# Patient Record
Sex: Male | Born: 1960
Health system: Southern US, Community
[De-identification: ages and names within clinical notes are randomized; demographics above are authoritative.]

## PROBLEM LIST (undated history)

## (undated) DIAGNOSIS — IMO0001 Reserved for inherently not codable concepts without codable children: Secondary | ICD-10-CM

## (undated) DIAGNOSIS — Z87442 Personal history of urinary calculi: Secondary | ICD-10-CM

## (undated) DIAGNOSIS — J309 Allergic rhinitis, unspecified: Secondary | ICD-10-CM

## (undated) DIAGNOSIS — N200 Calculus of kidney: Secondary | ICD-10-CM

## (undated) DIAGNOSIS — H1045 Other chronic allergic conjunctivitis: Secondary | ICD-10-CM

## (undated) DIAGNOSIS — E785 Hyperlipidemia, unspecified: Secondary | ICD-10-CM

## (undated) DIAGNOSIS — M1990 Unspecified osteoarthritis, unspecified site: Secondary | ICD-10-CM

## (undated) DIAGNOSIS — K7689 Other specified diseases of liver: Secondary | ICD-10-CM

## (undated) HISTORY — DX: Other chronic allergic conjunctivitis: H10.45

## (undated) HISTORY — DX: Unspecified osteoarthritis, unspecified site: M19.90

## (undated) HISTORY — DX: Reserved for inherently not codable concepts without codable children: IMO0001

## (undated) HISTORY — PX: ESOPHAGOGASTRODUODENOSCOPY: SHX1529

## (undated) HISTORY — PX: LITHOTRIPSY: SUR834

## (undated) HISTORY — DX: Calculus of kidney: N20.0

## (undated) HISTORY — PX: CATARACT EXTRACTION: SUR2

## (undated) HISTORY — DX: Hyperlipidemia, unspecified: E78.5

## (undated) HISTORY — DX: Other specified diseases of liver: K76.89

## (undated) HISTORY — PX: COLONOSCOPY: SHX174

## (undated) HISTORY — DX: Allergic rhinitis, unspecified: J30.9

---
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
## 2000-04-04 ENCOUNTER — Ambulatory Visit (HOSPITAL_COMMUNITY): Admission: RE | Admit: 2000-04-04 | Discharge: 2000-04-04 | Payer: Self-pay | Admitting: *Deleted

## 2004-09-21 ENCOUNTER — Ambulatory Visit: Payer: Self-pay | Admitting: Internal Medicine

## 2005-01-24 ENCOUNTER — Ambulatory Visit: Payer: Self-pay | Admitting: Internal Medicine

## 2005-03-18 ENCOUNTER — Ambulatory Visit: Payer: Self-pay | Admitting: Internal Medicine

## 2005-09-21 ENCOUNTER — Ambulatory Visit: Payer: Self-pay | Admitting: Internal Medicine

## 2006-01-24 ENCOUNTER — Ambulatory Visit: Payer: Self-pay | Admitting: Internal Medicine

## 2006-03-17 ENCOUNTER — Ambulatory Visit: Payer: Self-pay | Admitting: Internal Medicine

## 2006-10-04 ENCOUNTER — Ambulatory Visit: Payer: Self-pay | Admitting: Internal Medicine

## 2007-01-24 ENCOUNTER — Ambulatory Visit: Payer: Self-pay | Admitting: Internal Medicine

## 2007-04-03 ENCOUNTER — Ambulatory Visit: Payer: Self-pay | Admitting: Internal Medicine

## 2007-07-20 ENCOUNTER — Telehealth (INDEPENDENT_AMBULATORY_CARE_PROVIDER_SITE_OTHER): Payer: Self-pay | Admitting: *Deleted

## 2007-11-06 ENCOUNTER — Ambulatory Visit: Payer: Self-pay | Admitting: Internal Medicine

## 2008-01-23 DIAGNOSIS — H1045 Other chronic allergic conjunctivitis: Secondary | ICD-10-CM | POA: Insufficient documentation

## 2008-01-23 DIAGNOSIS — J309 Allergic rhinitis, unspecified: Secondary | ICD-10-CM | POA: Insufficient documentation

## 2008-01-24 ENCOUNTER — Ambulatory Visit: Payer: Self-pay | Admitting: Internal Medicine

## 2008-05-27 ENCOUNTER — Ambulatory Visit: Payer: Self-pay | Admitting: Internal Medicine

## 2008-05-27 ENCOUNTER — Telehealth (INDEPENDENT_AMBULATORY_CARE_PROVIDER_SITE_OTHER): Payer: Self-pay | Admitting: *Deleted

## 2008-06-24 ENCOUNTER — Telehealth (INDEPENDENT_AMBULATORY_CARE_PROVIDER_SITE_OTHER): Payer: Self-pay | Admitting: *Deleted

## 2008-10-10 ENCOUNTER — Telehealth (INDEPENDENT_AMBULATORY_CARE_PROVIDER_SITE_OTHER): Payer: Self-pay | Admitting: *Deleted

## 2009-01-23 ENCOUNTER — Ambulatory Visit: Payer: Self-pay | Admitting: Internal Medicine

## 2009-03-23 ENCOUNTER — Telehealth: Payer: Self-pay | Admitting: Internal Medicine

## 2009-04-03 ENCOUNTER — Ambulatory Visit: Payer: Self-pay | Admitting: Internal Medicine

## 2009-04-24 ENCOUNTER — Telehealth (INDEPENDENT_AMBULATORY_CARE_PROVIDER_SITE_OTHER): Payer: Self-pay | Admitting: *Deleted

## 2009-05-12 ENCOUNTER — Ambulatory Visit: Payer: Self-pay | Admitting: Internal Medicine

## 2009-09-29 ENCOUNTER — Ambulatory Visit: Payer: Self-pay | Admitting: Internal Medicine

## 2010-01-14 ENCOUNTER — Ambulatory Visit: Payer: Self-pay | Admitting: Internal Medicine

## 2010-03-30 ENCOUNTER — Ambulatory Visit: Payer: Self-pay | Admitting: Internal Medicine

## 2010-07-06 NOTE — Assessment & Plan Note (Signed)
Summary: 12 months/apc   Primary Christopher Hill/Referring Aquiles Ruffini:  Renne Crigler  CC:  Yearly follow up visit-allergies..  History of Present Illness: History of Present Illness: 01/24/08-50 year old man returning for follow-up of allergic rhinitis and conjunctivitis.  He continues allergy vaccine, giving his own at 1:10 with no problems.  He takes Allegra 180 mg daily.  On some days he substitutes, Allegra-D.  This has worked well.  01/23/09- Allergic rhinitis, conjunctivitis Good control over past year with no new problems, medical issues. Eyes burn more with contacts and he has appt with his opthalmologist. Denies headache, earache, postnasal drip, sneeze, chest tightness, cough, wheeze, chest pain, palpitation, heartburn.  January 14, 2010- Allergic rhinitis, conjunctivitis Continues allergy vaccine at 1:10 with no problems.. Recent cold caught while vacationing in [place] in cooler temperature. He asks about what to do with allegra being over the counter- he wants script for generic allegra for his HSA. Needs refill Epipen to have on hand, but hasn't needed to use it.    Preventive Screening-Counseling & Management  Alcohol-Tobacco     Smoking Status: never     Passive Smoke Exposure: yes  Current Medications (verified): 1)  Allegra-D 24 Hour 180-240 Mg  Tb24 (Fexofenadine-Pseudoephedrine) .... Once Daily As Needed 2)  Flonase 50 Mcg/act  Susp (Fluticasone Propionate) .... 2 Sprays in Each Nostril Once Daily 3)  Allergy Vaccine  1:10 .... Once Weekly 4)  Epipen 0.3 Mg/0.57ml (1:1000)  Devi (Epinephrine Hcl (Anaphylaxis)) .... As Directed 5)  Multivitamins   Tabs (Multiple Vitamin) .... Take 1 Tablet By Mouth Once A Day 6)  Allegra 180 Mg  Tabs (Fexofenadine Hcl) .... Take 1 By Mouth Once Daily 7)  Bd Tb Syringe 26g X 3/8" 1 Ml Misc (Tuberculin-Allergy Syringes) .... Use To Give Allergy Vaccine 8)  Aspirin 81 Mg Tbec (Aspirin) .... Take 1 By Mouth Once Daily  Allergies (verified): 1)   Zithromax  Past History:  Past Surgical History: Last updated: 01/23/2009 Tonsillectomy lithotripsy  Family History: Last updated: 01/14/2010 Mother- living age 57; Arterial/ Heart disease, Alleriges, otitis Father- living age 38, s/p CVA, CAD/ CABG/ valve replacement Sibling 1- living age 2   DM- Grandmother-maternal  Heart Disease- Grandmother-maternal Cancer-Grandfather-maternal-prostate; Grandmother-maternal-lung and colon  Social History: Last updated: 02/01/2008 Works [employer] Patient never smoked.  Positive history of passive tobacco smoke exposure.  Exercise- 2-3 times weekly Caffeine- 1-2 daily NonETOH Married with 1 child  Risk Factors: Smoking Status: never (01/14/2010) Passive Smoke Exposure: yes (01/14/2010)  Past Medical History: CONJUNCTIVITIS, ALLERGIC (ICD-372.14) ALLERGIC RHINITIS (ICD-477.9) kidney stones- protein and calcium  Family History: Mother- living age 62; Arterial/ Heart disease, Alleriges, otitis Father- living age 30, s/p CVA, CAD/ CABG/ valve replacement Sibling 1- living age 12   DM- Grandmother-maternal  Heart Disease- Grandmother-maternal Cancer-Grandfather-maternal-prostate; Grandmother-maternal-lung and colon  Review of Systems      See HPI       The patient complains of nasal congestion/difficulty breathing through nose and sneezing.  The patient denies shortness of breath with activity, shortness of breath at rest, productive cough, non-productive cough, coughing up blood, chest pain, irregular heartbeats, acid heartburn, indigestion, loss of appetite, weight change, abdominal pain, difficulty swallowing, sore throat, tooth/dental problems, and headaches.    Vital Signs:  Patient profile:   50 year old male Weight:      225.25 pounds O2 Sat:      98 % on Room air Pulse rate:   72 / minute BP sitting:   126 / 82  (left arm) Cuff  size:   regular  Vitals Entered By: Reynaldo Minium CMA (January 14, 2010 10:23 AM)  O2  Flow:  Room air CC: Yearly follow up visit-allergies.   Physical Exam  Additional Exam:  General: A/Ox3; pleasant and cooperative, NAD,  SKIN: no rash, lesions NODES: no lymphadenopathy HEENT: [place]/AT, EOM- WNL, Conjuctivae- clear, contact lenses, PERRLA, TM-WNL, Nose- clear, Throat- clear and wnl, Mallampati II NECK: Supple w/ fair ROM, JVD- none, normal carotid impulses w/o bruits Thyroid- normal to palpation CHEST: Clear to P&A HEART: RRR, no m/g/r heard ABDOMEN: Soft and nl;  EAV:WUJW, nl pulses, no edema  NEURO: Grossly intact to observation      Impression & Recommendations:  Problem # 1:  ALLERGIC RHINITIS (ICD-477.9)  Well controlled. We discussed antihistamne options. His updated medication list for this problem includes:    Flonase 50 Mcg/act Susp (Fluticasone propionate) .Marland Kitchen... 2 sprays in each nostril once daily    Allegra 180 Mg Tabs (Fexofenadine hcl) .Marland Kitchen... Take 1 by mouth once daily  Orders: Est. Patient Level III (11914)  Problem # 2:  CONJUNCTIVITIS, ALLERGIC (ICD-372.14)  Well controlled. Some seasonal variation, but he isn't routinely needing to medicate for this beyond antihistamines.  Orders: Est. Patient Level III (78295)  Medications Added to Medication List This Visit: 1)  Allergy Vaccine 1:10 Go  .... Once weekly 2)  Aspirin 81 Mg Tbec (Aspirin) .... Take 1 by mouth once daily  Patient Instructions: 1)  Please schedule a follow-up appointment in 1 year. 2)  meds refilled 3)  Continuing allergy vaccine Prescriptions: FLONASE 50 MCG/ACT  SUSP (FLUTICASONE PROPIONATE) 2 sprays in each nostril once daily  #1 x prn   Entered and Authorized by:   Waymon Budge MD   Signed by:   Waymon Budge MD on 01/14/2010   Method used:   Print then Give to Patient   RxID:   [id number] EPIPEN 0.3 MG/0.3ML (1:1000)  DEVI (EPINEPHRINE HCL (ANAPHYLAXIS)) as directed  #1 x prn   Entered and Authorized by:   Waymon Budge MD   Signed by:   Waymon Budge MD on 01/14/2010   Method used:   Print then Give to Patient   RxID:   [id number] ALLEGRA 180 MG  TABS (FEXOFENADINE HCL) take 1 by mouth once daily  #30 x prn   Entered and Authorized by:   Waymon Budge MD   Signed by:   Waymon Budge MD on 01/14/2010   Method used:   Print then Give to Patient   RxID:   [id number]

## 2010-07-06 NOTE — Assessment & Plan Note (Signed)
Summary: flu shot//jrc  Nurse Visit   Allergies: 1)  Zithromax  Orders Added: 1)  Admin 1st Vaccine [90471] 2)  Flu Vaccine 30yrs + [16109] Flu Vaccine Consent Questions     Do you have a history of severe allergic reactions to this vaccine? no    Any prior history of allergic reactions to egg and/or gelatin? no    Do you have a sensitivity to the preservative Thimersol? no    Do you have a past history of Guillan-Barre Syndrome? no    Do you currently have an acute febrile illness? no    Have you ever had a severe reaction to latex? no    Vaccine information given and explained to patient? yes    Are you currently pregnant? no    Lot Number:[id number]   Exp Date:12/04/2010   Site Given  Left Deltoid IM1  Clarise Cruz [facility])  March 30, 2010 11:48 AM

## 2010-08-13 ENCOUNTER — Ambulatory Visit (INDEPENDENT_AMBULATORY_CARE_PROVIDER_SITE_OTHER): Payer: BC Managed Care – PPO

## 2010-08-13 DIAGNOSIS — J301 Allergic rhinitis due to pollen: Secondary | ICD-10-CM

## 2010-08-16 ENCOUNTER — Encounter: Payer: Self-pay | Admitting: Internal Medicine

## 2010-08-16 DIAGNOSIS — J301 Allergic rhinitis due to pollen: Secondary | ICD-10-CM | POA: Insufficient documentation

## 2010-08-24 NOTE — Assessment & Plan Note (Signed)
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

## 2010-10-19 NOTE — Assessment & Plan Note (Signed)
[facility]                             PULMONARY OFFICE NOTE   NAME:Christopher Hill, Christopher Hill                        MRN:          [id number]  DATE:01/24/2007                            DOB:          Dec 12, 1960    PROBLEM:  1. Allergic rhinitis.  2. Allergic conjunctivitis.   HISTORY:  He says this has been a good year, but he finds he needs to  continue Allegra and Flonase on a daily basis. He continues allergy  vaccine without problems. He does not want to change this management.   MEDICATIONS:  1. Allegra 180 mg.  2. Fluticasone nasal spray.  3. Allergy vaccine.  4. Vitamins.  5. Epipen is available.   DRUG INTOLERANCES:  No medication allergy.   OBJECTIVE:  Weight 238 pounds, blood pressure 138/100, pulse 59. Room  air saturation: 97%.  Eyes are not injected. Nasal airway is clear. Pharynx is clear.  LUNGS:  Clear.  HEART: Sounds regular.   IMPRESSION:  Allergic rhinitis and conjunctivitis are doing very well.  We discussed options for reducing therapies with his choice to continue  as we are doing.   PLAN:  Allegra 180 and Flonase were refilled. Allergy vaccine is  continued. Schedule return in 12 months, earlier p.r.n. Reminder about  environmental precautions.     Clinton D. Maple Hudson, MD, Tonny Bollman, FACP  Electronically Signed    CDY/MedQ  DD: 01/24/2007  DT: 01/25/2007  Job #: [id number]   cc:   Soyla Murphy. Renne Crigler, M.D.

## 2010-10-22 NOTE — Procedures (Signed)
[facility]  Patient:    Christopher Hill, Christopher Hill                        MRN: [id number] Proc. Date: 04/04/00 Adm. Date:  [id number] Attending:  Sabino Gasser                           Procedure Report  PROCEDURE:  Colonoscopy.  INDICATIONS FOR PROCEDURE: Rectal bleeding.  ANESTHESIA:  Demerol 70 mg, Versed 7.5.  DESCRIPTION OF PROCEDURE:  With the patient mildly sedated in the left lateral decubitus position, a rectal exam was performed which was unremarkable. Subsequently, the Olympus videoscopic colonoscope was inserted in the rectum and passed under direct vision to the cecum. The cecum was identified by the ileocecal valve and appendiceal orifice both of which were photographed. From this point, the colonoscope was slowly withdrawn taking circumferential views of the entire colonic mucosa, stopping in the rectum which appeared normal on direct and showed internal hemorrhoids on retroflexed view. The endoscope was straightened and withdrawn. The patients vital signs and pulse oximeter remained stable. The patient tolerated the procedure well without apparent complications.  FINDINGS:  Internal hemorrhoids otherwise unremarkable examination.  PLAN:  Follow-up with me as needed and discuss and treat hemorrhoids as per usual. DD:  04/04/00 TD:  04/04/00 Job: [id number]

## 2010-10-22 NOTE — Assessment & Plan Note (Signed)
[facility]                               PULMONARY OFFICE NOTE   NAME:Christopher Hill, Christopher Hill                        MRN:          [id number]  DATE:01/24/2006                            DOB:          [birth year]    PROBLEMS:  1. Allergic rhinitis.  2. Allergic conjunctivitis.   HISTORY:  One-year followup.  He describes rare need for Patanol and says  overall this has been a very good year with no significant flares or  problems.  He has continued allergy vaccine at 1:10 giving his own.  We took  time again to review risks, benefits and alternative considerations for  allergy vaccine policy concerning administration out of the medical office,  anaphylaxis, and EpiPen.  He wishes to continue giving his own and signed  the waver form with appropriate discussion.   MEDICATIONS:  1. Allegra 180 mg.  2. Flonase.  3. Allergy vaccine at 1:10.  4. EpiPen.   ALLERGIES:  NO MEDICATION ALLERGY.   OBJECTIVE:  VITAL SIGNS:  Weight 230 pounds.  Blood pressure 122/62.  Pulse  regular, 77.  Room air saturation 99%.  EYES, NOSE, AND THROAT:  Clear with no sign of inflammation or drainage.  I  find no adenopathy.  LUNGS:  Clear to P and A.  HEART SOUNDS:  Regular without murmur.   IMPRESSION:  Continued stable, comfortable control of his allergic rhinitis  and allergic conjunctivitis with no changes needed.  We refilled his Allegra  and EpiPen and had the educational discussion described above.  Scheduled to  return in one year, earlier p.r.n.                                   Clinton D. Maple Hudson, MD, FCCP, FACP   CDY/MedQ  DD:  01/28/2006  DT:  01/29/2006  Job #:  [id number]   cc:   Soyla Murphy. Renne Crigler, MD

## 2010-12-22 ENCOUNTER — Ambulatory Visit (INDEPENDENT_AMBULATORY_CARE_PROVIDER_SITE_OTHER): Payer: BC Managed Care – PPO

## 2010-12-22 DIAGNOSIS — J309 Allergic rhinitis, unspecified: Secondary | ICD-10-CM

## 2011-01-12 ENCOUNTER — Ambulatory Visit: Payer: Self-pay | Admitting: Internal Medicine

## 2011-01-21 ENCOUNTER — Encounter: Payer: Self-pay | Admitting: *Deleted

## 2011-01-21 ENCOUNTER — Ambulatory Visit (INDEPENDENT_AMBULATORY_CARE_PROVIDER_SITE_OTHER): Payer: BC Managed Care – PPO | Admitting: Internal Medicine

## 2011-01-21 VITALS — BP 122/76 | HR 72 | Ht 71.5 in | Wt 207.0 lb

## 2011-01-21 DIAGNOSIS — H1045 Other chronic allergic conjunctivitis: Secondary | ICD-10-CM

## 2011-01-21 DIAGNOSIS — C9110 Chronic lymphocytic leukemia of B-cell type not having achieved remission: Secondary | ICD-10-CM

## 2011-01-21 DIAGNOSIS — IMO0001 Reserved for inherently not codable concepts without codable children: Secondary | ICD-10-CM | POA: Insufficient documentation

## 2011-01-21 DIAGNOSIS — J301 Allergic rhinitis due to pollen: Secondary | ICD-10-CM

## 2011-01-21 MED ORDER — "TUBERCULIN SYRINGE 26G X 3/8"" 1 ML MISC": Status: AC

## 2011-01-21 MED ORDER — FEXOFENADINE HCL 180 MG PO TABS: 180.0000 mg | ORAL_TABLET | Freq: Every day | ORAL | Status: DC

## 2011-01-21 MED ORDER — FLUTICASONE PROPIONATE 50 MCG/ACT NA SUSP: 2.0000 | Freq: Every day | NASAL | Status: DC

## 2011-01-21 MED ORDER — EPINEPHRINE 0.3 MG/0.3ML IJ DEVI: 0.3000 mg | Freq: Once | INTRAMUSCULAR | Status: DC

## 2011-01-21 NOTE — Progress Notes (Signed)
Subjective:    Patient ID: Christopher Hill, male    DOB: September 27, 1960, 50 y.o.   MRN: [id number]  HPI 01/21/11- 15 yoM never smoker followed for allergic rhinitis/ conjunctivitis complicated by CLL/ managed at [facility] w/o Rx Last here January 14, 2010 Dx'd w/ CLL by elevated WBC and being followed at stage 0 by Oncologist at [facility].  Motivated to lose some weight with weight watchers- down from 225 last year.  There was no concern about him staying on allergy shots, which have done well. He has not had significant nasal symptoms.  Did not get his typical bronchitis in April this year.   Review of Systems Constitutional:   No-   weight loss, night sweats, fevers, chills, fatigue, lassitude. HEENT:   No-  headaches, difficulty swallowing, tooth/dental problems, sore throat,       No-  sneezing, itching, ear ache, nasal congestion, post nasal drip,  CV:  No-   chest pain, orthopnea, PND, swelling in lower extremities, anasarca, dizziness, palpitations Resp: No-   shortness of breath with exertion or at rest.              No-   productive cough,  No non-productive cough,  No-  coughing up of blood.              No-   change in color of mucus.  No- wheezing.   Skin: No-   rash or lesions. GI:  No-   heartburn, indigestion, abdominal pain, nausea, vomiting, diarrhea,                 change in bowel habits, loss of appetite GU: No-   dysuria, change in color of urine, no urgency or frequency.  No- flank pain. MS:  No-   joint pain or swelling.  No- decreased range of motion.  No- back pain. Neuro- grossly normal to observation, Or:  Psych:  No- change in mood or affect. No depression or anxiety.  No memory loss.      Objective:   Physical Exam General- Alert, Oriented, Affect-appropriate, Distress- none acute    Weight loss- deliberate, but looks well Skin- rash-none, lesions- none, excoriation- none Lymphadenopathy- none Head- atraumatic            Eyes- Gross vision intact, PERRLA,  conjunctivae clear secretions            Ears- Hearing, canals normal            Nose- Clear, No-Septal dev, mucus, polyps, erosion, perforation             Throat- Mallampati II , mucosa clear , drainage- none, tonsils- atrophic Neck- flexible , trachea midline, no stridor , thyroid nl, carotid no bruit Chest - symmetrical excursion , unlabored           Heart/CV- RRR , no murmur , no gallop  , no rub, nl s1 s2                           - JVD- none , edema- none, stasis changes- none, varices- none           Lung- clear to P&A, wheeze- none, cough- none , dullness-none, rub- none           Chest wall-  Abd- tender-no, distended-no, bowel sounds-present, HSM- no Br/ Gen/ Rectal- Not done, not indicated Extrem- cyanosis- none, clubbing, none, atrophy- none, strength- nl Neuro- grossly intact to observation  Assessment & Plan:

## 2011-01-21 NOTE — Assessment & Plan Note (Signed)
Continuing vaccine and present meds.  Needing refills

## 2011-01-25 NOTE — Assessment & Plan Note (Signed)
Recently diagnosed problem with no impact clinically at this time.

## 2011-01-25 NOTE — Patient Instructions (Signed)
Continue allergy vaccine  Please call as needed 

## 2011-03-18 ENCOUNTER — Ambulatory Visit (INDEPENDENT_AMBULATORY_CARE_PROVIDER_SITE_OTHER): Payer: BC Managed Care – PPO

## 2011-03-18 DIAGNOSIS — Z23 Encounter for immunization: Secondary | ICD-10-CM

## 2011-05-17 ENCOUNTER — Ambulatory Visit (INDEPENDENT_AMBULATORY_CARE_PROVIDER_SITE_OTHER): Payer: BC Managed Care – PPO

## 2011-05-17 DIAGNOSIS — J309 Allergic rhinitis, unspecified: Secondary | ICD-10-CM

## 2011-05-25 ENCOUNTER — Telehealth: Payer: Self-pay | Admitting: Internal Medicine

## 2011-05-25 MED ORDER — AMOXICILLIN 500 MG PO CAPS: 500.0000 mg | ORAL_CAPSULE | Freq: Three times a day (TID) | ORAL | Status: AC

## 2011-05-25 NOTE — Telephone Encounter (Signed)
Pt says Zpak will cause GI upset and wishes to have an alternate medication. Pls advise. Allergies  Allergen Reactions  . Azithromycin     REACTION: GI upset

## 2011-05-25 NOTE — Telephone Encounter (Signed)
I spoke with pt and he c/o cough with thick brown phlem, chest congestion, PND, nasal congestion, sore throat, body aches, and ears feel full and is popping x 2 days. Pt denies any fever, nausea, vomiting. Pt has been taking mucinex d and tylenol. 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 Please advise Dr. Maple Hudson, thanks  Allergies  Allergen Reactions  . Azithromycin     REACTION: GI upset

## 2011-05-25 NOTE — Telephone Encounter (Signed)
Per CY--ok for amoxicillin 500mg    #21   1 tid until gone with no refills.  Called and spoke with pt and he is aware of meds sent to the pharmacy .

## 2011-05-25 NOTE — Telephone Encounter (Signed)
Pr CY-okay to give Zpak #1 take as directed no refills, continue fluids and Mucinex.

## 2011-10-25 ENCOUNTER — Ambulatory Visit (INDEPENDENT_AMBULATORY_CARE_PROVIDER_SITE_OTHER): Payer: BC Managed Care – PPO

## 2011-10-25 DIAGNOSIS — J309 Allergic rhinitis, unspecified: Secondary | ICD-10-CM

## 2011-12-14 ENCOUNTER — Other Ambulatory Visit: Payer: Self-pay | Admitting: Internal Medicine

## 2012-01-27 ENCOUNTER — Encounter: Payer: Self-pay | Admitting: Internal Medicine

## 2012-01-27 ENCOUNTER — Ambulatory Visit (INDEPENDENT_AMBULATORY_CARE_PROVIDER_SITE_OTHER): Payer: BC Managed Care – PPO | Admitting: Internal Medicine

## 2012-01-27 VITALS — BP 130/96 | HR 64 | Ht 71.5 in | Wt 214.2 lb

## 2012-01-27 DIAGNOSIS — J301 Allergic rhinitis due to pollen: Secondary | ICD-10-CM

## 2012-01-27 DIAGNOSIS — H1045 Other chronic allergic conjunctivitis: Secondary | ICD-10-CM

## 2012-01-27 MED ORDER — FLUTICASONE PROPIONATE 50 MCG/ACT NA SUSP: 2.0000 | Freq: Every day | NASAL | Status: DC

## 2012-01-27 MED ORDER — EPINEPHRINE 0.3 MG/0.3ML IJ DEVI: 0.3000 mg | Freq: Once | INTRAMUSCULAR | Status: AC

## 2012-01-27 NOTE — Patient Instructions (Addendum)
Refill scripts for flonase and Epipen  We can continue allergy vaccine   Please call as needed

## 2012-01-27 NOTE — Progress Notes (Signed)
Subjective:    Patient ID: Christopher Hill, male    DOB: 03-14-1961, 51 y.o.   MRN: [id number]  HPI 01/21/11- 72 yoM never smoker followed for allergic rhinitis/ conjunctivitis complicated by CLL/ managed at [facility] w/o Rx Last here January 14, 2010 Dx'd w/ CLL by elevated WBC and being followed at stage 0 by Oncologist at [facility].  Motivated to lose some weight with weight watchers- down from 225 last year.  There was no concern about him staying on allergy shots, which have done well. He has not had significant nasal symptoms.  Did not get his typical bronchitis in April this year.   01/27/12- 51 yoM never smoker followed for allergic rhinitis/ conjunctivitis, complicated by CLL/ managed at [facility] w/o Rx Still on vaccine and doing well; denies any major flare ups He continues oncology followup at [facility] and they have no concerns about his allergy vaccine. Continues daily Flonase and occasional Allegra. Has cataracts and we discussed this in relation to nasal steroid therapy.  Review of Systems-see HPI Constitutional:   No-   weight loss, night sweats, fevers, chills, fatigue, lassitude. HEENT:   No-  headaches, difficulty swallowing, tooth/dental problems, sore throat,       Controlled sneezing, itching, ear ache, nasal congestion, post nasal drip,  CV:  No-   chest pain, orthopnea, PND, swelling in lower extremities, anasarca, dizziness, palpitations Resp: No-   shortness of breath with exertion or at rest.              No-   productive cough,  No non-productive cough,  No-  coughing up of blood.              No-   change in color of mucus.  No- wheezing.   Skin: No-   rash or lesions. GI:  No-   heartburn, indigestion, abdominal pain, nausea, vomiting,  GU:  MS:  No-   joint pain or swelling.   Neuro- nothing unusual:  Psych:  No- change in mood or affect. No depression or anxiety.  No memory loss.  Objective:   Physical Exam General- Alert, Oriented, Affect-appropriate, Distress-  none acute    Weight loss- deliberate, but looks well Skin- rash-none, lesions- none, excoriation- none Lymphadenopathy- none Head- atraumatic            Eyes- Gross vision intact, PERRLA, conjunctivae clear secretions            Ears- Hearing, canals normal            Nose- Clear, No-Septal dev, mucus, polyps, erosion, perforation             Throat- Mallampati II , mucosa clear , drainage- none, tonsils- atrophic Neck- flexible , trachea midline, no stridor , thyroid nl, carotid no bruit Chest - symmetrical excursion , unlabored           Heart/CV- RRR , no murmur , no gallop  , no rub, nl s1 s2                           - JVD- none , edema- none, stasis changes- none, varices- none           Lung- clear to P&A, wheeze- none, cough- none , dullness-none, rub- none           Chest wall-  Abd- Br/ Gen/ Rectal- Not done, not indicated Extrem- cyanosis- none, clubbing, none, atrophy- none, strength- nl Neuro- grossly intact  to observation    Assessment & Plan:

## 2012-02-05 NOTE — Assessment & Plan Note (Signed)
We discussed the effect of his allergy vaccine and medications including steroid nasal spray. No changes needed.

## 2012-02-05 NOTE — Assessment & Plan Note (Signed)
He is satisfied to continue vaccine. We discussed medications including EpiPen and Flonase.

## 2012-03-05 ENCOUNTER — Telehealth: Payer: Self-pay | Admitting: Internal Medicine

## 2012-03-05 NOTE — Telephone Encounter (Signed)
I spoke with pt and he is scheduled to come in 03/07/12 around 9:30 for this. Nothing further was needed

## 2012-03-07 ENCOUNTER — Ambulatory Visit (INDEPENDENT_AMBULATORY_CARE_PROVIDER_SITE_OTHER): Payer: BC Managed Care – PPO

## 2012-03-07 DIAGNOSIS — Z23 Encounter for immunization: Secondary | ICD-10-CM

## 2012-03-09 ENCOUNTER — Telehealth: Payer: Self-pay | Admitting: Internal Medicine

## 2012-03-09 MED ORDER — DOXYCYCLINE HYCLATE 100 MG PO TABS: ORAL_TABLET | ORAL | Status: DC

## 2012-03-09 NOTE — Telephone Encounter (Signed)
Per CY-probably a cold; take Mucinex DM and give Doxycycline 100mg  #8 take 2 today then 1 daily no refills.

## 2012-03-09 NOTE — Telephone Encounter (Signed)
Called and spoke with pt and he is aware of CY recs and the doxy has been sent to the pts pharmacy and he is aware to take the mucinex dm per box instructions.  Pt voiced his understanding and nothing further is needed.

## 2012-03-09 NOTE — Telephone Encounter (Signed)
Called and spoke with pt and he stated that for 2 days he has had head and chest congestion with cough and yellow sputum, low grade fever.  Pt stated that he has been taking the allegra daily and did not know if he needs to change this med to something else.  Is requesting recs from CY.  Please advise. Thanks  Allergies  Allergen Reactions  . Azithromycin     REACTION: GI upset

## 2012-05-22 ENCOUNTER — Ambulatory Visit (INDEPENDENT_AMBULATORY_CARE_PROVIDER_SITE_OTHER): Payer: BC Managed Care – PPO

## 2012-05-22 DIAGNOSIS — J309 Allergic rhinitis, unspecified: Secondary | ICD-10-CM

## 2012-06-06 DIAGNOSIS — C9110 Chronic lymphocytic leukemia of B-cell type not having achieved remission: Secondary | ICD-10-CM

## 2012-06-06 HISTORY — DX: Chronic lymphocytic leukemia of B-cell type not having achieved remission: C91.10

## 2012-07-09 ENCOUNTER — Encounter: Payer: Self-pay | Admitting: Internal Medicine

## 2012-08-01 ENCOUNTER — Telehealth: Payer: Self-pay | Admitting: Internal Medicine

## 2012-08-01 MED ORDER — AMOXICILLIN-POT CLAVULANATE 875-125 MG PO TABS: 1.0000 | ORAL_TABLET | Freq: Two times a day (BID) | ORAL | Status: DC

## 2012-08-01 NOTE — Telephone Encounter (Signed)
Last OV 8-13, next ov 01/28/13. Pt is c/o having sinus congestion and pressure, chest congestion, productive cough with yellow/brown phlegm, sore throat and fever x 4 days. Pt denies any sob, wheezing, chest tightness, n/v. Please advise. Carron Curie, CMA Allergies  Allergen Reactions  . Azithromycin     REACTION: GI upset

## 2012-08-01 NOTE — Telephone Encounter (Signed)
Pt advised and rx sent. Aixa Corsello, CMA  

## 2012-08-01 NOTE — Telephone Encounter (Signed)
Per CY-okay Augmentin 875 mg #14 take 1 po bid no refills; Fluids and Mucinex OTC.

## 2012-08-09 ENCOUNTER — Ambulatory Visit (AMBULATORY_SURGERY_CENTER): Payer: BC Managed Care – PPO | Admitting: *Deleted

## 2012-08-09 VITALS — Ht 71.5 in | Wt 215.6 lb

## 2012-08-09 DIAGNOSIS — Z1211 Encounter for screening for malignant neoplasm of colon: Secondary | ICD-10-CM

## 2012-08-09 MED ORDER — NA SULFATE-K SULFATE-MG SULF 17.5-3.13-1.6 GM/177ML PO SOLN: ORAL | Status: DC

## 2012-08-14 ENCOUNTER — Encounter: Payer: Self-pay | Admitting: Internal Medicine

## 2012-08-23 ENCOUNTER — Ambulatory Visit (AMBULATORY_SURGERY_CENTER): Payer: BC Managed Care – PPO | Admitting: Internal Medicine

## 2012-08-23 ENCOUNTER — Encounter: Payer: BC Managed Care – PPO | Admitting: Internal Medicine

## 2012-08-23 ENCOUNTER — Encounter: Payer: Self-pay | Admitting: Internal Medicine

## 2012-08-23 VITALS — BP 125/76 | HR 65 | Temp 96.7°F | Resp 19 | Ht 71.0 in | Wt 215.0 lb

## 2012-08-23 DIAGNOSIS — K648 Other hemorrhoids: Secondary | ICD-10-CM

## 2012-08-23 DIAGNOSIS — D128 Benign neoplasm of rectum: Secondary | ICD-10-CM

## 2012-08-23 DIAGNOSIS — Z1211 Encounter for screening for malignant neoplasm of colon: Secondary | ICD-10-CM

## 2012-08-23 DIAGNOSIS — D129 Benign neoplasm of anus and anal canal: Secondary | ICD-10-CM

## 2012-08-23 MED ORDER — SODIUM CHLORIDE 0.9 % IV SOLN: 500.0000 mL | INTRAVENOUS | Status: DC

## 2012-08-23 NOTE — Progress Notes (Signed)
Patient did not experience any of the following events: a burn prior to discharge; a fall within the facility; wrong site/side/patient/procedure/implant event; or a hospital transfer or hospital admission upon discharge from the facility. (G8907) Patient did not have preoperative order for IV antibiotic SSI prophylaxis. (G8918)  

## 2012-08-23 NOTE — Progress Notes (Signed)
Lidocaine-40mg IV prior to Propofol InductionPropofol given over incremental dosages 

## 2012-08-23 NOTE — Patient Instructions (Addendum)
There was a small nodule or polyp in the ano-rectal area. Probably scar tissue but I took biopsies to be sure. Also have internal hemorrhoids - small, as you did on 2001 colonoscopy.  Otherwise all ok and great prep.  I will let you know pathology results and when to have another routine colonoscopy by mail.  Thank you for choosing me and [facility].  Iva Boop, MD, FACG YOU HAD AN ENDOSCOPIC PROCEDURE TODAY AT THE [facility]: Refer to the procedure report that was given to you for any specific questions about what was found during the examination.  If the procedure report does not answer your questions, please call your gastroenterologist to clarify.  If you requested that your care partner not be given the details of your procedure findings, then the procedure report has been included in a sealed envelope for you to review at your convenience later.  YOU SHOULD EXPECT: Some feelings of bloating in the abdomen. Passage of more gas than usual.  Walking can help get rid of the air that was put into your GI tract during the procedure and reduce the bloating. If you had a lower endoscopy (such as a colonoscopy or flexible sigmoidoscopy) you may notice spotting of blood in your stool or on the toilet paper. If you underwent a bowel prep for your procedure, then you may not have a normal bowel movement for a few days.  DIET: Your first meal following the procedure should be a light meal and then it is ok to progress to your normal diet.  A half-sandwich or bowl of soup is an example of a good first meal.  Heavy or fried foods are harder to digest and may make you feel nauseous or bloated.  Likewise meals heavy in dairy and vegetables can cause extra gas to form and this can also increase the bloating.  Drink plenty of fluids but you should avoid alcoholic beverages for 24 hours.  ACTIVITY: Your care partner should take you home directly after the procedure.  You should  plan to take it easy, moving slowly for the rest of the day.  You can resume normal activity the day after the procedure however you should NOT DRIVE or use heavy machinery for 24 hours (because of the sedation medicines used during the test).    SYMPTOMS TO REPORT IMMEDIATELY: A gastroenterologist can be reached at any hour.  During normal business hours, 8:30 AM to 5:00 PM Monday through Friday, call [phone].  After hours and on weekends, please call the GI answering service at [phone] who will take a message and have the physician on call contact you.   Following lower endoscopy (colonoscopy or flexible sigmoidoscopy):  Excessive amounts of blood in the stool  Significant tenderness or worsening of abdominal pains  Swelling of the abdomen that is new, acute  Fever of 100F or higher  Following upper endoscopy (EGD)  Vomiting of blood or coffee ground material  New chest pain or pain under the shoulder blades  Painful or persistently difficult swallowing  New shortness of breath  Fever of 100F or higher  Black, tarry-looking stools  FOLLOW UP: If any biopsies were taken you will be contacted by phone or by letter within the next 1-3 weeks.  Call your gastroenterologist if you have not heard about the biopsies in 3 weeks.  Our staff will call the home number listed on your records the next business day following your procedure to check on  you and address any questions or concerns that you may have at that time regarding the information given to you following your procedure. This is a courtesy call and so if there is no answer at the home number and we have not heard from you through the emergency physician on call, we will assume that you have returned to your regular daily activities without incident.  SIGNATURES/CONFIDENTIALITY: You and/or your care partner have signed paperwork which will be entered into your electronic medical record.  These signatures attest to the fact  that that the information above on your After Visit Summary has been reviewed and is understood.  Full responsibility of the confidentiality of this discharge information lies with you and/or your care-partner.

## 2012-08-23 NOTE — Op Note (Signed)
[facility] [address] [place], [address]   COLONOSCOPY PROCEDURE REPORT  PATIENT: Christopher Hill, Christopher Hill  MR#: [id number] BIRTHDATE: 1960/11/03 , 51  yrs. old GENDER: Male ENDOSCOPIST: Iva Boop, MD, [facility] REFERRED XB:[id number] Terri Piedra, M.D. PROCEDURE DATE:  08/23/2012 PROCEDURE:   Colonoscopy with biopsy ASA CLASS:   Class II INDICATIONS:average risk screening. MEDICATIONS: propofol (Diprivan) 300mg  IV, MAC sedation, administered by CRNA, and These medications were titrated to patient response per physician's verbal order  DESCRIPTION OF PROCEDURE:   After the risks benefits and alternatives of the procedure were thoroughly explained, informed consent was obtained.  A digital rectal exam revealed a palpable rectal nodule - firm and mobile, A digital rectal exam revealed no prostatic nodules, and A digital rectal exam revealed the prostate was not enlarged.   The LB CF-H180AL [id number]  endoscope was introduced through the anus and advanced to the cecum, which was identified by both the appendix and ileocecal valve. No adverse events experienced.   The quality of the prep was Suprep excellent The instrument was then slowly withdrawn as the colon was fully examined.      COLON FINDINGS: A smooth and firm semi-pedunculated polyp measuring 8 mm in size was found in the rectum.  Multiple biopsies were performed using cold forceps.   Small internal hemorrhoids were found.   The colon mucosa was otherwise normal.   A right colon retroflexion was performed.  Retroflexed views revealed internal hemorrhoids. The time to cecum=2 minutes 38 seconds.  Withdrawal time=10 minutes 20 seconds.  The scope was withdrawn and the procedure completed. COMPLICATIONS: There were no complications.  ENDOSCOPIC IMPRESSION: 1.   Semi-pedunculated polyp measuring 8 mm in size was found in the rectum; multiple biopsies were performed using cold forceps - suspect this is an  inconsequential fibro-epithelial polyp - it correlates with mobile nodule palpated on rectal exam. 2.   Small internal hemorrhoids 3.   The colon mucosa was otherwise normal w/ excellent prep  RECOMMENDATIONS: Timing of repeat colonoscopy will be determined by pathology findings.   eSigned:  Iva Boop, MD, [facility] 08/23/2012 9:57 AM cc: Romero Liner, MD and The Patient

## 2012-08-24 ENCOUNTER — Telehealth: Payer: Self-pay | Admitting: *Deleted

## 2012-08-24 NOTE — Telephone Encounter (Signed)
No answer, left message to call if questions or concerns. 

## 2012-08-29 ENCOUNTER — Encounter: Payer: Self-pay | Admitting: Internal Medicine

## 2012-08-29 NOTE — Progress Notes (Signed)
Quick Note:  Fibro-epithelial polyp anal canal ______

## 2012-11-28 ENCOUNTER — Ambulatory Visit (INDEPENDENT_AMBULATORY_CARE_PROVIDER_SITE_OTHER): Payer: BC Managed Care – PPO

## 2012-11-28 DIAGNOSIS — J309 Allergic rhinitis, unspecified: Secondary | ICD-10-CM

## 2013-01-28 ENCOUNTER — Encounter: Payer: Self-pay | Admitting: Internal Medicine

## 2013-01-28 ENCOUNTER — Ambulatory Visit (INDEPENDENT_AMBULATORY_CARE_PROVIDER_SITE_OTHER): Payer: BC Managed Care – PPO | Admitting: Internal Medicine

## 2013-01-28 VITALS — BP 120/70 | HR 72 | Ht 70.5 in | Wt 219.2 lb

## 2013-01-28 DIAGNOSIS — J301 Allergic rhinitis due to pollen: Secondary | ICD-10-CM

## 2013-01-28 MED ORDER — FLUTICASONE PROPIONATE 50 MCG/ACT NA SUSP: 2.0000 | Freq: Every day | NASAL | Status: DC

## 2013-01-28 NOTE — Progress Notes (Signed)
Subjective:    Patient ID: Christopher Hill, male    DOB: 22-Sep-1960, 52 y.o.   MRN: [id number]  HPI 01/21/11- 58 yoM never smoker followed for allergic rhinitis/ conjunctivitis complicated by CLL/ managed at [facility] w/o Rx Last here January 14, 2010 Dx'd w/ CLL by elevated WBC and being followed at stage 0 by Oncologist at [facility].  Motivated to lose some weight with weight watchers- down from 225 last year.  There was no concern about him staying on allergy shots, which have done well. He has not had significant nasal symptoms.  Did not get his typical bronchitis in April this year.   01/27/12- 50 yoM never smoker followed for allergic rhinitis/ conjunctivitis, complicated by CLL/ managed at [facility] w/o Rx Still on vaccine and doing well; denies any major flare ups He continues oncology followup at [facility] and they have no concerns about his allergy vaccine. Continues daily Flonase and occasional Allegra. Has cataracts and we discussed this in relation to nasal steroid therapy.  01/28/14- 52 yoM never smoker followed for allergic rhinitis/ conjunctivitis, complicated by CLL/ managed at [facility] w/o Rx FOLLOWS FOR: still on  allergy vaccine 1:10 GO and no reactions; having sinus headaches and congestion in head. Eyes have been drier since cataract surgery  Review of Systems-see HPI Constitutional:   No-   weight loss, night sweats, fevers, chills, fatigue, lassitude. HEENT:   No-  headaches, difficulty swallowing, tooth/dental problems, sore throat,       Controlled sneezing, itching, ear ache, +nasal congestion, post nasal drip,  CV:  No-   chest pain, orthopnea, PND, swelling in lower extremities, anasarca, dizziness, palpitations Resp: No-   shortness of breath with exertion or at rest.              No-   productive cough,  No non-productive cough,  No-  coughing up of blood.              No-   change in color of mucus.  No- wheezing.   Skin: No-   rash or lesions. GI:  No-   heartburn,  indigestion, abdominal pain, nausea, vomiting,  GU:  MS:  No-   joint pain or swelling.   Neuro- nothing unusual:  Psych:  No- change in mood or affect. No depression or anxiety.  No memory loss.  Objective:   Physical Exam General- Alert, Oriented, Affect-appropriate, Distress- none acute    Weight loss- deliberate, but looks well Skin- rash-none, lesions- none, excoriation- none Lymphadenopathy- none Head- atraumatic            Eyes- Gross vision intact, PERRLA, conjunctivae clear secretions            Ears- Hearing, canals normal            Nose- Clear, No-Septal dev, mucus, polyps, erosion, perforation             Throat- Mallampati II , mucosa clear , drainage- none, tonsils- atrophic Neck- flexible , trachea midline, no stridor , thyroid nl, carotid no bruit Chest - symmetrical excursion , unlabored           Heart/CV- RRR , no murmur , no gallop  , no rub, nl s1 s2                           - JVD- none , edema- none, stasis changes- none, varices- none  Lung- clear to P&A, wheeze- none, cough- none , dullness-none, rub- none           Chest wall-  Abd- Br/ Gen/ Rectal- Not done, not indicated Extrem- cyanosis- none, clubbing, none, atrophy- none, strength- nl Neuro- grossly intact to observation    Assessment & Plan:

## 2013-01-28 NOTE — Patient Instructions (Addendum)
We can continue allergy vaccine 1:10 GO   Flonase refilled   Please call as needed

## 2013-02-09 NOTE — Assessment & Plan Note (Signed)
Okay to continue allergy vaccine. Plan-refill allergy vaccine and refill Flonase

## 2013-03-12 ENCOUNTER — Telehealth: Payer: Self-pay | Admitting: Internal Medicine

## 2013-03-12 MED ORDER — "TUBERCULIN SYRINGE 25G X 5/8"" 1 ML MISC": Status: DC

## 2013-03-12 NOTE — Telephone Encounter (Signed)
Pt aware that we have sent Rx for allergy needles to his pharmacy on file.

## 2013-03-12 NOTE — Telephone Encounter (Signed)
Katie what do we order for these syringes? Carron Curie, CMA

## 2013-04-03 ENCOUNTER — Telehealth: Payer: Self-pay | Admitting: Internal Medicine

## 2013-04-03 MED ORDER — "TUBERCULIN SYRINGE 25G X 5/8"" 1 ML MISC": Status: DC

## 2013-04-03 NOTE — Telephone Encounter (Signed)
RX for this was sent in 03/12/13. I called [facility]. They did not received this RX sent to them. I gave VO to the pharmacists, nothing further needed

## 2013-04-11 ENCOUNTER — Ambulatory Visit (INDEPENDENT_AMBULATORY_CARE_PROVIDER_SITE_OTHER): Payer: BC Managed Care – PPO

## 2013-04-11 DIAGNOSIS — J309 Allergic rhinitis, unspecified: Secondary | ICD-10-CM

## 2013-05-22 ENCOUNTER — Telehealth: Payer: Self-pay | Admitting: Internal Medicine

## 2013-05-22 MED ORDER — AMOXICILLIN-POT CLAVULANATE 875-125 MG PO TABS: 1.0000 | ORAL_TABLET | Freq: Two times a day (BID) | ORAL | Status: DC

## 2013-05-22 NOTE — Telephone Encounter (Signed)
lmomtcb x1 for pt 

## 2013-05-22 NOTE — Telephone Encounter (Signed)
Last OV 01/28/13. Pt is c/o having sinus congestion, chest congestion, productive cough with yellow phlegm, sore throat, chest tightness x 4 days. Pt is taking sudafed severe cold without relief. Please advise. Carron Curie, CMA Allergies  Allergen Reactions  . Azithromycin     REACTION: GI upset   Current Outpatient Prescriptions on File Prior to Visit  Medication Sig Dispense Refill  . aspirin 81 MG tablet Take 81 mg by mouth daily.        Marland Kitchen atorvastatin (LIPITOR) 40 MG tablet Take 1 tablet by mouth daily.      . fluticasone (FLONASE) 50 MCG/ACT nasal spray Place 2 sprays into the nose daily.  16 g  prn  . Multiple Vitamin (MULTIVITAMIN) tablet Take 1 tablet by mouth daily.        . TUBERCULIN SYR 1CC/25GX5/8" 25G X 5/8" 1 ML MISC Use as directed weekly for allergy vaccine  100 each  0  . UNABLE TO FIND Med Name: Allergy shot 1 time weekly      . WAL-FEX ALLERGY 180 MG tablet TAKE 1 TABLET BY MOUTH DAILY  30 tablet  0   No current facility-administered medications on file prior to visit.

## 2013-05-22 NOTE — Telephone Encounter (Signed)
Per CDY: Augmentin 875mg  #14, 1 BID  Called spoke with patient and advised of CDY's recs as stated above.  Pt okay with these recommnedations and verbalized his understanding.  Pt asked for some OTC recommendations as well >> advised to continue the Allegra, Mucinex DM 1-2 BID w/ fluids, saline nasal spray and rinse.  Rx sent to verified pharmacy and pt denies any further questions.  Will sign off.

## 2013-10-02 ENCOUNTER — Telehealth: Payer: Self-pay | Admitting: Internal Medicine

## 2013-10-02 MED ORDER — DOXYCYCLINE HYCLATE 100 MG PO TABS: ORAL_TABLET | ORAL | Status: DC

## 2013-10-02 NOTE — Telephone Encounter (Signed)
Spoke with pt. Thinks he is getting bronchitis. Reports chest congestion, PND, sore throat and cough. Mucus is yellow/brown in color. Chest feels full and tight. Onset was 2-3 days ago. Has been taking allergy shots, Allegra and Flonase with minimal relief. Would like something called in.  Allergies  Allergen Reactions  . Azithromycin     REACTION: GI upset   Current Outpatient Prescriptions on File Prior to Visit  Medication Sig Dispense Refill  . amoxicillin-clavulanate (AUGMENTIN) 875-125 MG per tablet Take 1 tablet by mouth 2 (two) times daily.  14 tablet  0  . aspirin 81 MG tablet Take 81 mg by mouth daily.        Marland Kitchen atorvastatin (LIPITOR) 40 MG tablet Take 1 tablet by mouth daily.      . fluticasone (FLONASE) 50 MCG/ACT nasal spray Place 2 sprays into the nose daily.  16 g  prn  . Multiple Vitamin (MULTIVITAMIN) tablet Take 1 tablet by mouth daily.        . TUBERCULIN SYR 1CC/25GX5/8" 25G X 5/8" 1 ML MISC Use as directed weekly for allergy vaccine  100 each  0  . UNABLE TO FIND Med Name: Allergy shot 1 time weekly      . WAL-FEX ALLERGY 180 MG tablet TAKE 1 TABLET BY MOUTH DAILY  30 tablet  0   No current facility-administered medications on file prior to visit.    CY - please advise. Thanks.

## 2013-10-02 NOTE — Telephone Encounter (Signed)
Spoke with patient-he is aware of Rx per CY and will add in Mucinex DM OTC. Rx sent to [facility] file and confirmed with patient. Nothing more needed at this time.

## 2013-10-02 NOTE — Telephone Encounter (Signed)
LMTCB

## 2013-10-02 NOTE — Telephone Encounter (Signed)
Offer doxycycline 100 mg, # 8, 2 today then one daily  Suggest also Mucinex-DM otc

## 2013-10-22 ENCOUNTER — Ambulatory Visit (INDEPENDENT_AMBULATORY_CARE_PROVIDER_SITE_OTHER): Payer: BC Managed Care – PPO

## 2013-10-22 DIAGNOSIS — J309 Allergic rhinitis, unspecified: Secondary | ICD-10-CM

## 2014-02-03 ENCOUNTER — Ambulatory Visit (INDEPENDENT_AMBULATORY_CARE_PROVIDER_SITE_OTHER): Payer: BC Managed Care – PPO | Admitting: Internal Medicine

## 2014-02-03 ENCOUNTER — Encounter: Payer: Self-pay | Admitting: Internal Medicine

## 2014-02-03 VITALS — BP 144/88 | HR 88 | Ht 72.0 in | Wt 223.0 lb

## 2014-02-03 DIAGNOSIS — J301 Allergic rhinitis due to pollen: Secondary | ICD-10-CM

## 2014-02-03 DIAGNOSIS — H1045 Other chronic allergic conjunctivitis: Secondary | ICD-10-CM

## 2014-02-03 NOTE — Progress Notes (Signed)
Subjective:    Patient ID: Christopher Hill, male    DOB: 1960-11-17, 53 y.o.   MRN: [id number]  HPI 01/21/11- 51 yoM never smoker followed for allergic rhinitis/ conjunctivitis complicated by CLL/ managed at [facility] w/o Rx Last here January 14, 2010 Dx'd w/ CLL by elevated WBC and being followed at stage 0 by Oncologist at [facility].  Motivated to lose some weight with weight watchers- down from 225 last year.  There was no concern about him staying on allergy shots, which have done well. He has not had significant nasal symptoms.  Did not get his typical bronchitis in April this year.   01/27/12- 50 yoM never smoker followed for allergic rhinitis/ conjunctivitis, complicated by CLL/ managed at [facility] w/o Rx Still on vaccine and doing well; denies any major flare ups He continues oncology followup at [facility] and they have no concerns about his allergy vaccine. Continues daily Flonase and occasional Allegra. Has cataracts and we discussed this in relation to nasal steroid therapy.  01/28/13- 68 yoM never smoker followed for allergic rhinitis/ conjunctivitis, complicated by CLL/ managed at [facility] w/o Rx FOLLOWS FOR: still on  allergy vaccine 1:10 GO and no reactions; having sinus headaches and congestion in head. Eyes have been drier since cataract surgery  02/03/14- 53 yoM never smoker followed for allergic rhinitis/ conjunctivitis, complicated by CLL/ managed at [facility] w/o Rx FOLLOWS FOR: Pt here for yearly follow up. Pt states overall  he is doing well. Pt states he is tolerating his Allergy vaccine 1:10 well.  Weight gain he blames on stress-wife has had difficult time with atrial fibrillation. Otherwise breathing has been good. He can tell if he misses allergy vaccine by a week so he wishes to continue. Mild nasal stuffiness and the ears pop.  Review of Systems-see HPI Constitutional:   No-   weight loss, night sweats, fevers, chills, fatigue, lassitude. HEENT:   No-  headaches, difficulty  swallowing, tooth/dental problems, sore throat,       Controlled sneezing, itching, ear ache, +nasal congestion, post nasal drip,  CV:  No-   chest pain, orthopnea, PND, swelling in lower extremities, anasarca, dizziness, palpitations Resp: No-   shortness of breath with exertion or at rest.              No-   productive cough,  No non-productive cough,  No-  coughing up of blood.              No-   change in color of mucus.  No- wheezing.   Skin: No-   rash or lesions. GI:  No-   heartburn, indigestion, abdominal pain, nausea, vomiting,  GU:  MS:  No-   joint pain or swelling.   Neuro- nothing unusual:  Psych:  No- change in mood or affect. No depression or anxiety.  No memory loss.  Objective:   Physical Exam General- Alert, Oriented, Affect-appropriate, Distress- none acute    Weight loss- deliberate, but looks well Skin- rash-none, lesions- none, excoriation- none Lymphadenopathy- none Head- atraumatic            Eyes- Gross vision intact, PERRLA, conjunctivae clear secretions            Ears- Hearing, canals normal            Nose- + minor sniffling, No-Septal dev, mucus, polyps, erosion, perforation             Throat- Mallampati II , mucosa clear , drainage- none, tonsils- atrophic Neck- flexible ,  trachea midline, no stridor , thyroid nl, carotid no bruit Chest - symmetrical excursion , unlabored           Heart/CV- RRR , no murmur , no gallop  , no rub, nl s1 s2                           - JVD- none , edema- none, stasis changes- none, varices- none           Lung- clear to P&A, wheeze- none, cough- none , dullness-none, rub- none           Chest wall-  Abd- Br/ Gen/ Rectal- Not done, not indicated Extrem- cyanosis- none, clubbing, none, atrophy- none, strength- nl Neuro- grossly intact to observation    Assessment & Plan:

## 2014-02-03 NOTE — Patient Instructions (Signed)
Ok to continue Allergy Vaccine 1:10 GO  Ok to continue allegra as needed  Consider trying otc decongestant Sudafed-PE to help with stuffiness and ear pressure when needed

## 2014-02-04 NOTE — Assessment & Plan Note (Signed)
He wishes to continue allergy vaccine. We discussed eustachian dysfunction and nasal congestion Plan-try Sudafed PE

## 2014-02-04 NOTE — Assessment & Plan Note (Signed)
Managed at [facility] not sure what ICD.10 diagnosis they would use

## 2014-02-04 NOTE — Assessment & Plan Note (Signed)
Controlled now outside of spring season

## 2014-02-18 ENCOUNTER — Ambulatory Visit (INDEPENDENT_AMBULATORY_CARE_PROVIDER_SITE_OTHER): Payer: BC Managed Care – PPO

## 2014-02-18 ENCOUNTER — Telehealth: Payer: Self-pay | Admitting: Internal Medicine

## 2014-02-18 DIAGNOSIS — Z23 Encounter for immunization: Secondary | ICD-10-CM

## 2014-02-18 NOTE — Telephone Encounter (Signed)
Pt will come by today around 3:30pm to get his flu shot; triage nurse to give. No referral needed per [facility].

## 2014-04-18 ENCOUNTER — Encounter: Payer: Self-pay | Admitting: Internal Medicine

## 2014-05-14 ENCOUNTER — Ambulatory Visit (INDEPENDENT_AMBULATORY_CARE_PROVIDER_SITE_OTHER): Payer: BC Managed Care – PPO

## 2014-05-14 DIAGNOSIS — J309 Allergic rhinitis, unspecified: Secondary | ICD-10-CM

## 2014-12-04 NOTE — Telephone Encounter (Signed)
Date Mixed: 12/05/14 Vial: 2 Strength: 1:10 Mail,Here,Pick Up: mail  Mixed By: Laurette Schimke

## 2014-12-05 ENCOUNTER — Telehealth: Payer: Self-pay | Admitting: Internal Medicine

## 2014-12-11 ENCOUNTER — Ambulatory Visit (INDEPENDENT_AMBULATORY_CARE_PROVIDER_SITE_OTHER): Payer: BLUE CROSS/BLUE SHIELD

## 2014-12-11 ENCOUNTER — Telehealth: Payer: Self-pay | Admitting: Internal Medicine

## 2014-12-11 DIAGNOSIS — J309 Allergic rhinitis, unspecified: Secondary | ICD-10-CM

## 2014-12-11 NOTE — Telephone Encounter (Signed)
Date Mixed: 12/11/14 Vial: 2 Strength: 1:10 Here/Mail/Pick Up: mail Mixed By: Laurette Schimke

## 2015-02-04 ENCOUNTER — Ambulatory Visit: Payer: BC Managed Care – PPO | Admitting: Internal Medicine

## 2015-02-05 ENCOUNTER — Encounter: Payer: Self-pay | Admitting: Internal Medicine

## 2015-02-05 ENCOUNTER — Ambulatory Visit (INDEPENDENT_AMBULATORY_CARE_PROVIDER_SITE_OTHER): Payer: BLUE CROSS/BLUE SHIELD | Admitting: Internal Medicine

## 2015-02-05 VITALS — BP 134/66 | HR 67 | Ht 72.0 in | Wt 210.6 lb

## 2015-02-05 DIAGNOSIS — IMO0001 Reserved for inherently not codable concepts without codable children: Secondary | ICD-10-CM

## 2015-02-05 DIAGNOSIS — J301 Allergic rhinitis due to pollen: Secondary | ICD-10-CM

## 2015-02-05 DIAGNOSIS — C9100 Acute lymphoblastic leukemia not having achieved remission: Secondary | ICD-10-CM | POA: Diagnosis not present

## 2015-02-05 MED ORDER — EPINEPHRINE 0.3 MG/0.3ML IJ SOAJ: 0.3000 mg | Freq: Once | INTRAMUSCULAR | Status: DC

## 2015-02-05 NOTE — Progress Notes (Signed)
Subjective:    Patient ID: Christopher Hill, male    DOB: 1960/08/15, 54 y.o.   MRN: [id number]  HPI 01/21/11- 10 yoM never smoker followed for allergic rhinitis/ conjunctivitis complicated by CLL/ managed at [facility] w/o Rx Last here January 14, 2010 Dx'd w/ CLL by elevated WBC and being followed at stage 0 by Oncologist at [facility].  Motivated to lose some weight with weight watchers- down from 225 last year.  There was no concern about him staying on allergy shots, which have done well. He has not had significant nasal symptoms.  Did not get his typical bronchitis in April this year.   01/27/12- 54 yoM never smoker followed for allergic rhinitis/ conjunctivitis, complicated by CLL/ managed at [facility] w/o Rx Still on vaccine and doing well; denies any major flare ups He continues oncology followup at [facility] and they have no concerns about his allergy vaccine. Continues daily Flonase and occasional Allegra. Has cataracts and we discussed this in relation to nasal steroid therapy.  01/28/13- 54 yoM never smoker followed for allergic rhinitis/ conjunctivitis, complicated by CLL/ managed at [facility] w/o Rx FOLLOWS FOR: still on  allergy vaccine 1:10 GO and no reactions; having sinus headaches and congestion in head. Eyes have been drier since cataract surgery  02/03/14- 54 yoM never smoker followed for allergic rhinitis/ conjunctivitis, complicated by CLL/ managed at [facility] w/o Rx FOLLOWS FOR: Pt here for yearly follow up. Pt states overall  he is doing well. Pt states he is tolerating his  Allergy vaccine 1:10 GO well.  Weight gain he blames on stress-wife has had difficult time with atrial fibrillation. Otherwise breathing has been good. He can tell if he misses allergy vaccine by a week so he wishes to continue. Mild nasal stuffiness and the ears pop.  02/05/15- 54 yoM never smoker followed for allergic rhinitis/ conjunctivitis, complicated by CLL/ managed at [facility] w/o Rx FOLLOWS FOR: Still on  Allergy 1:10 GO vaccine and doing well overall; no troubles through the spring time. Allergy symptoms well controlled this year. CLL in good control now 5 years out- followed at [facility].  Review of Systems-see HPI Constitutional:   No-   weight loss, night sweats, fevers, chills, fatigue, lassitude. HEENT:   No-  headaches, difficulty swallowing, tooth/dental problems, sore throat,       Controlled sneezing, itching, ear ache, +nasal congestion, post nasal drip,  CV:  No-   chest pain, orthopnea, PND, swelling in lower extremities, anasarca, dizziness, palpitations Resp: No-   shortness of breath with exertion or at rest.              No-   productive cough,  No non-productive cough,  No-  coughing up of blood.              No-   change in color of mucus.  No- wheezing.   Skin: No-   rash or lesions. GI:  No-   heartburn, indigestion, abdominal pain, nausea, vomiting,  GU:  MS:  No-   joint pain or swelling.   Neuro- nothing unusual:  Psych:  No- change in mood or affect. No depression or anxiety.  No memory loss.  Objective:   Physical Exam General- Alert, Oriented, Affect-appropriate, Distress- none acute, looks well Skin- rash-none, lesions- none, excoriation- none Lymphadenopathy- none Head- atraumatic            Eyes- Gross vision intact, PERRLA, conjunctivae clear secretions            Ears-  Hearing, canals normal            Nose- + minor sniffling, No-Septal dev, mucus, polyps, erosion, perforation             Throat- Mallampati II , mucosa clear , drainage- none, tonsils- atrophic Neck- flexible , trachea midline, no stridor , thyroid nl, carotid no bruit Chest - symmetrical excursion , unlabored           Heart/CV- RRR , no murmur , no gallop  , no rub, nl s1 s2                           - JVD- none , edema- none, stasis changes- none, varices- none           Lung- clear to P&A, wheeze- none, cough- none , dullness-none, rub- none           Chest wall-  Abd- Br/ Gen/  Rectal- Not done, not indicated Extrem- cyanosis- none, clubbing, none, atrophy- none, strength- nl Neuro- grossly intact to observation    Assessment & Plan:

## 2015-02-05 NOTE — Patient Instructions (Signed)
We can continue allergy vaccine 1:10 GO  Epipen refilled

## 2015-02-16 ENCOUNTER — Ambulatory Visit (INDEPENDENT_AMBULATORY_CARE_PROVIDER_SITE_OTHER): Payer: BLUE CROSS/BLUE SHIELD

## 2015-02-16 DIAGNOSIS — Z23 Encounter for immunization: Secondary | ICD-10-CM

## 2015-02-16 NOTE — Assessment & Plan Note (Signed)
Sustained remission, followed at [facility]

## 2015-02-16 NOTE — Assessment & Plan Note (Signed)
Very food control over the past year. We discussed going perhaps one more year, then stopping vaccine.

## 2015-02-17 DIAGNOSIS — Z23 Encounter for immunization: Secondary | ICD-10-CM | POA: Diagnosis not present

## 2015-03-09 ENCOUNTER — Encounter: Payer: Self-pay | Admitting: Internal Medicine

## 2015-05-22 ENCOUNTER — Telehealth: Payer: Self-pay | Admitting: Internal Medicine

## 2015-05-22 DIAGNOSIS — J309 Allergic rhinitis, unspecified: Secondary | ICD-10-CM | POA: Diagnosis not present

## 2015-05-22 NOTE — Telephone Encounter (Signed)
Allergy Serum Extract Date Mixed: 05/22/15 Vial: 2 Strength: 1:10 Here/Mail/Pick Up: mail Mixed By: tbs Last OV: 02/05/15 Pending OV: 01/08/16

## 2015-05-25 ENCOUNTER — Telehealth: Payer: Self-pay | Admitting: Internal Medicine

## 2015-05-25 ENCOUNTER — Ambulatory Visit (INDEPENDENT_AMBULATORY_CARE_PROVIDER_SITE_OTHER): Payer: BLUE CROSS/BLUE SHIELD

## 2015-05-25 DIAGNOSIS — J309 Allergic rhinitis, unspecified: Secondary | ICD-10-CM

## 2015-05-25 NOTE — Telephone Encounter (Signed)
Allergy Serum Extract Date Mixed: 05/25/2015 Vial: AB Strength: 1:10 Here/Mail/Pick Up: Mail Mixed By: Desmond Dike, CMA Last OV: 02/05/2016 Pending OV: 01/08/2016

## 2015-06-23 ENCOUNTER — Telehealth: Payer: Self-pay | Admitting: Internal Medicine

## 2015-06-23 MED ORDER — OSELTAMIVIR PHOSPHATE 75 MG PO CAPS: 75.0000 mg | ORAL_CAPSULE | Freq: Two times a day (BID) | ORAL | Status: DC

## 2015-06-23 MED ORDER — DOXYCYCLINE HYCLATE 100 MG PO TABS: ORAL_TABLET | ORAL | Status: DC

## 2015-06-23 NOTE — Telephone Encounter (Signed)
Pt aware of rec's per CY.  Rxs called into [facility].  Nothing further needed.

## 2015-06-23 NOTE — Telephone Encounter (Signed)
Offer Tamilfu 75 mg, # 10, 1 twice daily           Doxycycline 100 mg, # 8, 2 today then one daily          Stay well hydrated

## 2015-06-23 NOTE — Telephone Encounter (Signed)
Pt c/o chest congestion, fever(100.7-101.4)- controlled with Tylenol, cough with Yellow/brown thick mucus x 2 days. Denies wheezing and SOB. Pt notes some body aches but relates this to the fever. Fever only present x 1 day.  Using OTC nasal decongestant.  Allergies  Allergen Reactions  . Azithromycin     REACTION: GI upset   Please advise Dr Annamaria Boots. Thanks.     Medication List       This list is accurate as of: 06/23/15  9:05 AM.  Always use your most recent med list.               aspirin 81 MG tablet  Take 81 mg by mouth daily.     atorvastatin 40 MG tablet  Commonly known as:  LIPITOR  Take 20 mg by mouth daily.     EPINEPHrine 0.3 mg/0.3 mL Soaj injection  Commonly known as:  EPI-PEN  Inject 0.3 mLs (0.3 mg total) into the muscle once.     fluticasone 50 MCG/ACT nasal spray  Commonly known as:  FLONASE  Place 2 sprays into the nose daily.     multivitamin tablet  Take 1 tablet by mouth daily.     NONFORMULARY OR COMPOUNDED ITEM  Allergy Vaccine 1:10 Given at [facility] 1CC/25GX5/8" 25G X 5/8" 1 ML Misc  Use as directed weekly for allergy vaccine     UNABLE TO FIND  Med Name: Allergy shot 1 time weekly     WAL-F[facility] ALLERGY 180 MG tablet  Generic drug:  fexofenadine  TAKE 1 TABLET BY MOUTH DAILY

## 2015-12-11 ENCOUNTER — Telehealth: Payer: Self-pay | Admitting: Internal Medicine

## 2015-12-11 DIAGNOSIS — J309 Allergic rhinitis, unspecified: Secondary | ICD-10-CM | POA: Diagnosis not present

## 2015-12-11 NOTE — Telephone Encounter (Signed)
Allergy Serum Extract Date Mixed: 12/11/15 Vial: 2 Strength: 1:10 Here/Mail/Pick Up: mail Mixed By: tbs Last OV: 02/05/15 Pending OV: 01/08/16

## 2016-01-08 ENCOUNTER — Ambulatory Visit (INDEPENDENT_AMBULATORY_CARE_PROVIDER_SITE_OTHER): Payer: BLUE CROSS/BLUE SHIELD | Admitting: Internal Medicine

## 2016-01-08 ENCOUNTER — Encounter: Payer: Self-pay | Admitting: Internal Medicine

## 2016-01-08 VITALS — BP 114/76 | HR 58 | Ht 72.0 in | Wt 206.2 lb

## 2016-01-08 DIAGNOSIS — H1045 Other chronic allergic conjunctivitis: Secondary | ICD-10-CM

## 2016-01-08 DIAGNOSIS — J301 Allergic rhinitis due to pollen: Secondary | ICD-10-CM

## 2016-01-08 NOTE — Progress Notes (Signed)
Subjective:    Patient ID: Christopher Hill, male    DOB: 09-18-1960, 55 y.o.   MRN: [id number]  HPI 01/21/11- 27 yoM never smoker followed for allergic rhinitis/ conjunctivitis complicated by CLL/ managed at [facility] w/o Rx Last here January 14, 2010 Dx'd w/ CLL by elevated WBC and being followed at stage 0 by Oncologist at [facility].  Motivated to lose some weight with weight watchers- down from 225 last year.  There was no concern about him staying on allergy shots, which have done well. He has not had significant nasal symptoms.  Did not get his typical bronchitis in April this year.   01/27/12- 50 yoM never smoker followed for allergic rhinitis/ conjunctivitis, complicated by CLL/ managed at [facility] w/o Rx Still on vaccine and doing well; denies any major flare ups He continues oncology followup at [facility] and they have no concerns about his allergy vaccine. Continues daily Flonase and occasional Allegra. Has cataracts and we discussed this in relation to nasal steroid therapy.  01/28/13- 43 yoM never smoker followed for allergic rhinitis/ conjunctivitis, complicated by CLL/ managed at [facility] w/o Rx FOLLOWS FOR: still on  allergy vaccine 1:10 GO and no reactions; having sinus headaches and congestion in head. Eyes have been drier since cataract surgery  02/03/14- 53 yoM never smoker followed for allergic rhinitis/ conjunctivitis, complicated by CLL/ managed at [facility] w/o Rx FOLLOWS FOR: Pt here for yearly follow up. Pt states overall  he is doing well. Pt states he is tolerating his  Allergy vaccine 1:10 GO well.  Weight gain he blames on stress-wife has had difficult time with atrial fibrillation. Otherwise breathing has been good. He can tell if he misses allergy vaccine by a week so he wishes to continue. Mild nasal stuffiness and the ears pop.  02/05/15- 66 yoM never smoker followed for allergic rhinitis/ conjunctivitis, complicated by CLL/ managed at [facility] w/o Rx FOLLOWS FOR: Still on  Allergy 1:10 GO vaccine and doing well overall; no troubles through the spring time. Allergy symptoms well controlled this year. CLL in good control now 5 years out- followed at [facility].  01/08/2016-55 year old male never smoker followed for allergic rhinitis/conjunctivitis, complicated by CLL/managed at [facility] without treatment Allergy Vaccine 1:10 GO FOLLOWS FOR: Pt states this summer has been dry and therfore he is having dry, itchy eyes.  No problems with allergy vaccine. We discussed plans to close the clinic. Has continued daily fexofenadine to control nasal allergy and postnasal drainage. Flonase. No asthma. He continues to be followed at [facility] for his CLL without requiring chemotherapy.  Review of Systems-see HPI Constitutional:   No-   weight loss, night sweats, fevers, chills, fatigue, lassitude. HEENT:   No-  headaches, difficulty swallowing, tooth/dental problems, sore throat,       Controlled sneezing, itching, ear ache, +nasal congestion, +post nasal drip,  CV:  No-   chest pain, orthopnea, PND, swelling in lower extremities, anasarca, dizziness, palpitations Resp: No-   shortness of breath with exertion or at rest.              No-   productive cough,  No non-productive cough,  No-  coughing up of blood.              No-   change in color of mucus.  No- wheezing.   Skin: No-   rash or lesions. GI:  No-   heartburn, indigestion, abdominal pain, nausea, vomiting,  GU:  MS:  No-   joint pain or swelling.  Neuro- nothing unusual:  Psych:  No- change in mood or affect. No depression or anxiety.  No memory loss.  Objective:   Physical Exam General- Alert, Oriented, Affect-appropriate, Distress- none acute, looks well Skin- rash-none, lesions- none, excoriation- none Lymphadenopathy- none Head- atraumatic            Eyes- Gross vision intact, PERRLA, conjunctivae clear secretions            Ears- Hearing, canals normal            Nose- + Turbinate edema, No-Septal dev,  mucus, polyps, erosion, perforation             Throat- Mallampati II , mucosa clear , drainage- none, tonsils- atrophic Neck- flexible , trachea midline, no stridor , thyroid nl, carotid no bruit Chest - symmetrical excursion , unlabored           Heart/CV- RRR , no murmur , no gallop  , no rub, nl s1 s2                           - JVD- none , edema- none, stasis changes- none, varices- none           Lung- clear to P&A, wheeze- none, cough- none , dullness-none, rub- none           Chest wall-  Abd- Br/ Gen/ Rectal- Not done, not indicated Extrem- cyanosis- none, clubbing, none, atrophy- none, strength- nl Neuro- grossly intact to observation    Assessment & Plan:

## 2016-01-08 NOTE — Assessment & Plan Note (Signed)
He has been on allergy vaccine long enough to hopefully sustained benefit when he stops. He will use up his current supply then wait to see how he does with option to transfer to another allergy practice after we close this winter. He is feeling over dried now in very dry summer weather. He still feels he needs management for postnasal drip. Plan-suggest sample Dymista. Try to wean off of maintenance fexofenadine to reduce dryness.

## 2016-01-08 NOTE — Assessment & Plan Note (Signed)
Distinguish between allergic conjunctivitis and dry eye. Avoid direct air conditioner drafts, prolonged staring as a computer screens, etc.

## 2016-01-08 NOTE — Patient Instructions (Signed)
Sample Dymista nasal spray     1-2 puffs each nostril once daily   See if this lets you reduce the amount of allegra/ fexofenadine you need, hoping to help with dryness.  As discussed you can use up your current supply of allergy vaccine, then stop for observation, to transfer to another allergy practice in town.  P;lease call as needed

## 2016-02-02 ENCOUNTER — Telehealth: Payer: Self-pay | Admitting: Internal Medicine

## 2016-02-02 MED ORDER — AZELASTINE-FLUTICASONE 137-50 MCG/ACT NA SUSP: NASAL | 6 refills | Status: DC

## 2016-02-02 NOTE — Telephone Encounter (Signed)
lmomtcb x1 for pt 

## 2016-02-02 NOTE — Telephone Encounter (Signed)
Spoke with pt and verified that he wants [facility] [address].  Rx sent.  Nothing further needed.

## 2016-02-02 NOTE — Telephone Encounter (Signed)
[phone] pt calling back

## 2016-02-12 ENCOUNTER — Telehealth: Payer: Self-pay | Admitting: Internal Medicine

## 2016-02-12 NOTE — Telephone Encounter (Signed)
Per last OV note: Sample Dymista nasal spray     1-2 puffs each nostril once daily    See if this lets you reduce the amount of allegra/ fexofenadine you need, hoping to help with dryness.   As discussed you can use up your current supply of allergy vaccine, then stop for observation, to transfer to another allergy practice in town.   P;lease call as needed ---  Called spoke with pt. He reports he was given trial of dymista. He reports he likes this but insurance will not cover this. He currently has flonase. He was told we could call in Rx in to take in combination with this.  Please advise Dr. Janeice Robinson

## 2016-02-12 NOTE — Telephone Encounter (Signed)
lmtcb X1 

## 2016-02-12 NOTE — Telephone Encounter (Signed)
Ok to Rx Azelastine nasal spray  # 1,   1-2 puffs each nostril once or twice daily. This can be used with flonase Refill x 12

## 2016-02-15 MED ORDER — AZELASTINE HCL 0.1 % NA SOLN: 1.0000 | Freq: Two times a day (BID) | NASAL | 12 refills | Status: DC

## 2016-02-15 NOTE — Telephone Encounter (Signed)
Patient returned call, CB [phone].

## 2016-02-15 NOTE — Telephone Encounter (Signed)
lmtcb x2 for pt. 

## 2016-02-15 NOTE — Telephone Encounter (Signed)
Spoke with pt and advised of CY's recommendation. [facility] Usage instructions reviewed with pt. Rx sent. Nothing further needed.

## 2016-03-03 ENCOUNTER — Ambulatory Visit (INDEPENDENT_AMBULATORY_CARE_PROVIDER_SITE_OTHER): Payer: BLUE CROSS/BLUE SHIELD

## 2016-03-03 DIAGNOSIS — Z23 Encounter for immunization: Secondary | ICD-10-CM | POA: Diagnosis not present

## 2016-05-16 ENCOUNTER — Telehealth: Payer: Self-pay | Admitting: *Deleted

## 2016-05-16 NOTE — Telephone Encounter (Addendum)
Pt. Sent in allergy order. I looked at pt.'s last visit (01/08/2016) and CY discussed with pt. About closing the clinic. He suggested he finish his current supply and stop vaccine for observation. I called the pt. To remind him of CY's recs, left a message on his cell phone. Nothing further needed.

## 2017-03-09 ENCOUNTER — Other Ambulatory Visit: Payer: Self-pay | Admitting: Internal Medicine

## 2017-04-24 DIAGNOSIS — B351 Tinea unguium: Secondary | ICD-10-CM | POA: Diagnosis not present

## 2017-08-02 DIAGNOSIS — N529 Male erectile dysfunction, unspecified: Secondary | ICD-10-CM | POA: Diagnosis not present

## 2017-08-02 DIAGNOSIS — R7309 Other abnormal glucose: Secondary | ICD-10-CM | POA: Diagnosis not present

## 2017-08-02 DIAGNOSIS — E7800 Pure hypercholesterolemia, unspecified: Secondary | ICD-10-CM | POA: Diagnosis not present

## 2017-08-07 DIAGNOSIS — J302 Other seasonal allergic rhinitis: Secondary | ICD-10-CM | POA: Diagnosis not present

## 2017-08-07 DIAGNOSIS — C9190 Lymphoid leukemia, unspecified not having achieved remission: Secondary | ICD-10-CM | POA: Diagnosis not present

## 2017-08-07 DIAGNOSIS — M1612 Unilateral primary osteoarthritis, left hip: Secondary | ICD-10-CM | POA: Diagnosis not present

## 2017-08-07 DIAGNOSIS — R7303 Prediabetes: Secondary | ICD-10-CM | POA: Diagnosis not present

## 2017-08-07 DIAGNOSIS — Z87442 Personal history of urinary calculi: Secondary | ICD-10-CM | POA: Diagnosis not present

## 2017-08-09 DIAGNOSIS — Z808 Family history of malignant neoplasm of other organs or systems: Secondary | ICD-10-CM | POA: Diagnosis not present

## 2017-08-09 DIAGNOSIS — R7309 Other abnormal glucose: Secondary | ICD-10-CM | POA: Diagnosis not present

## 2017-08-09 DIAGNOSIS — Z0000 Encounter for general adult medical examination without abnormal findings: Secondary | ICD-10-CM | POA: Diagnosis not present

## 2017-08-09 DIAGNOSIS — R292 Abnormal reflex: Secondary | ICD-10-CM | POA: Diagnosis not present

## 2018-01-11 DIAGNOSIS — M1612 Unilateral primary osteoarthritis, left hip: Secondary | ICD-10-CM | POA: Diagnosis not present

## 2018-01-25 DIAGNOSIS — M1612 Unilateral primary osteoarthritis, left hip: Secondary | ICD-10-CM | POA: Diagnosis not present

## 2018-05-28 DIAGNOSIS — M1612 Unilateral primary osteoarthritis, left hip: Secondary | ICD-10-CM | POA: Diagnosis not present

## 2018-08-06 DIAGNOSIS — J302 Other seasonal allergic rhinitis: Secondary | ICD-10-CM | POA: Diagnosis not present

## 2018-08-06 DIAGNOSIS — M1612 Unilateral primary osteoarthritis, left hip: Secondary | ICD-10-CM | POA: Diagnosis not present

## 2018-08-06 DIAGNOSIS — R7303 Prediabetes: Secondary | ICD-10-CM | POA: Diagnosis not present

## 2018-08-06 DIAGNOSIS — C9110 Chronic lymphocytic leukemia of B-cell type not having achieved remission: Secondary | ICD-10-CM | POA: Diagnosis not present

## 2018-08-10 DIAGNOSIS — E7800 Pure hypercholesterolemia, unspecified: Secondary | ICD-10-CM | POA: Diagnosis not present

## 2018-08-10 DIAGNOSIS — Z1159 Encounter for screening for other viral diseases: Secondary | ICD-10-CM | POA: Diagnosis not present

## 2018-08-10 DIAGNOSIS — Z125 Encounter for screening for malignant neoplasm of prostate: Secondary | ICD-10-CM | POA: Diagnosis not present

## 2018-08-10 DIAGNOSIS — Z0000 Encounter for general adult medical examination without abnormal findings: Secondary | ICD-10-CM | POA: Diagnosis not present

## 2018-08-13 DIAGNOSIS — Z0000 Encounter for general adult medical examination without abnormal findings: Secondary | ICD-10-CM | POA: Diagnosis not present

## 2018-08-15 DIAGNOSIS — M1612 Unilateral primary osteoarthritis, left hip: Secondary | ICD-10-CM | POA: Diagnosis not present

## 2018-08-15 DIAGNOSIS — Z0000 Encounter for general adult medical examination without abnormal findings: Secondary | ICD-10-CM | POA: Diagnosis not present

## 2018-08-27 DIAGNOSIS — L218 Other seborrheic dermatitis: Secondary | ICD-10-CM | POA: Diagnosis not present

## 2018-08-28 ENCOUNTER — Telehealth (INDEPENDENT_AMBULATORY_CARE_PROVIDER_SITE_OTHER): Payer: Self-pay

## 2018-08-28 NOTE — Telephone Encounter (Signed)
Called patient, answered no to all prescreening questions  

## 2018-08-29 ENCOUNTER — Other Ambulatory Visit: Payer: Self-pay

## 2018-08-29 ENCOUNTER — Encounter (INDEPENDENT_AMBULATORY_CARE_PROVIDER_SITE_OTHER): Payer: Self-pay | Admitting: Orthopaedic Surgery

## 2018-08-29 ENCOUNTER — Ambulatory Visit (INDEPENDENT_AMBULATORY_CARE_PROVIDER_SITE_OTHER): Payer: BLUE CROSS/BLUE SHIELD | Admitting: Orthopaedic Surgery

## 2018-08-29 VITALS — Ht 70.5 in | Wt 210.0 lb

## 2018-08-29 DIAGNOSIS — M1612 Unilateral primary osteoarthritis, left hip: Secondary | ICD-10-CM | POA: Insufficient documentation

## 2018-08-29 MED ORDER — METHYLPREDNISOLONE ACETATE 40 MG/ML IJ SUSP: 40.0000 mg | Freq: Once | INTRAMUSCULAR | Status: DC

## 2018-08-29 NOTE — Progress Notes (Signed)
Office Visit Note   Patient: Christopher Hill           Date of Birth: [birth year]           MRN: [id number] Visit Date: 08/29/2018              Requested by: Deland Pretty, MD [address] [place], [place] [address] PCP: Deland Pretty, MD   Assessment & Plan: Visit Diagnoses:  1. Unilateral primary osteoarthritis, left hip     Plan: The patient does have severe end-stage arthritis of his left hip.  He is tried and failed all forms conservative treatment.  We went over hip replacement model and explained in detail what hip replacement surgery involves the direct anterior approach.  We had a thorough discussion about the risk and benefits of the surgery.  We talked about his intraoperative and postoperative course.  In light of the coronavirus pandemic we are having to delay surgery.  I do feel it is appropriate for him to have a consultation by my partner Dr. Junius Roads for an intra-articular steroid injection in the left hip joint under direct fluoroscopy which can hopefully help temporize things while we await scheduling surgery.  He agrees with this as well.  All question concerns were answered and addressed.  We will work on getting his surgery scheduled for left total hip arthroplasty while we are allowed to do so.  Follow-Up Instructions: Return for 2 weeks post-op.   Orders:  No orders of the defined types were placed in this encounter.  No orders of the defined types were placed in this encounter.     Procedures: No procedures performed   Clinical Data: No additional findings.   Subjective: Chief Complaint  Patient presents with  . Left Hip - Pain  The patient is a very pleasant 58 year old gentleman who comes in for evaluation treatment of known left hip pain and osteoarthritis.  He is actually been seen by other orthopedic surgeons before and have recommended total hip arthroplasty on his left hip.  He has had multiple steroid injections in that left hip as  well.  Daily last now about 6 to 8 weeks.  He has done a lot of research and feels that he is comfortable with proceeding with anterior hip replacement surgery at this standpoint.  He has been dealing with hip pain now for about 4 to 5 years.  It really flared up last July and is been getting significantly worse.  His pain is daily and it is detrimentally affecting his mobility, his quality of life and his activities daily living.  It is hard to get a comfortable position either standing or laying down.  It does wake him up at night.  He reports significant left hip stiffness and pain in the groin.  He has problems crossing his left leg over his right.  He has a problem putting shoes and socks on.  He is otherwise a very healthy 58 year old gentleman.  He is not a smoker and is not diabetic.  Again he has had conservative treatment for well over a year now.  HPI  Review of Systems He currently denies any headache, chest pain, shortness of breath, fever, chills, nausea, vomiting  Objective: Vital Signs: Ht 5' 10.5" (1.791 m)   Wt 210 lb (95.3 kg)   BMI 29.71 kg/m   Physical Exam He is alert and orient x3 and in no acute distress Ortho Exam Examination of his right hip is normal examination of  his left hip shows significant stiffness and significant deficits in internal and external rotation.  He has severe pain with internal and external rotation as well of the left hip.  He has slight ligament discrepancy with the left side being just a few millimeters shorter than the right side. Specialty Comments:  No specialty comments available.  Imaging: No results found. X-rays that accompany the patient that are independent review shows severe end-stage arthritis of the left hip.  The right hip appears normal.  Left hip is shortened.  He has significant and complete loss of the superior lateral joint space.  There are sclerotic changes as well as para-articular osteophytes.  PMFS History: Patient  Active Problem List   Diagnosis Date Noted  . Unilateral primary osteoarthritis, left hip 08/29/2018  . Chronic lymphoblastic leukemia 01/21/2011  . Allergic rhinitis due to pollen 08/16/2010  . CONJUNCTIVITIS, ALLERGIC 01/23/2008   Past Medical History:  Diagnosis Date  . Allergic rhinitis, cause unspecified   . Chronic lymphoblastic leukemia   . Kidney stones    protein and calcium  . Other chronic allergic conjunctivitis     Family History  Problem Relation Age of Onset  . Heart disease Mother        arterial  . Allergies Mother   . Otitis media Mother   . Stroke Father   . Other Father        CABG-valve replacement  . Coronary artery disease Father   . Diabetes Maternal Grandmother   . Heart disease Maternal Grandmother   . Lung cancer Maternal Grandmother   . Colon cancer Maternal Grandmother 44  . Prostate cancer Maternal Grandfather     Past Surgical History:  Procedure Laterality Date  . CATARACT EXTRACTION    . COLONOSCOPY    . LITHOTRIPSY  1997, 1995  . TONSILLECTOMY  1964   Social History   Occupational History    Employer: [employer]  Tobacco Use  . Smoking status: Never Smoker  . Smokeless tobacco: Never Used  Substance and Sexual Activity  . Alcohol use: No  . Drug use: No  . Sexual activity: Not on file

## 2018-08-29 NOTE — Addendum Note (Signed)
Addended by: Hortencia Pilar on: 08/29/2018 03:46 PM   Modules accepted: Orders

## 2018-08-29 NOTE — Progress Notes (Signed)
Subjective: Patient is here for ultrasound-guided intra-articular left hip injection.  Objective: Limited range of motion and pain was flexion and internal rotation.  Procedure: Ultrasound-guided left hip injection: After sterile prep with Betadine, injected 8 cc 1% lidocaine without epinephrine and 40 mg methylprednisolone using a 22-gauge spinal needle, passing the needle through the iliofemoral ligament into the femoral head/neck junction.  Injectate was seen filling the joint capsule.  He had very good pain relief during the immediate anesthetic phase.  He will follow-up as directed.

## 2018-10-16 DIAGNOSIS — L570 Actinic keratosis: Secondary | ICD-10-CM | POA: Diagnosis not present

## 2018-10-23 ENCOUNTER — Other Ambulatory Visit: Payer: Self-pay | Admitting: Physician Assistant

## 2018-10-23 DIAGNOSIS — Z1211 Encounter for screening for malignant neoplasm of colon: Secondary | ICD-10-CM | POA: Diagnosis not present

## 2018-10-23 DIAGNOSIS — Z1212 Encounter for screening for malignant neoplasm of rectum: Secondary | ICD-10-CM | POA: Diagnosis not present

## 2018-10-26 ENCOUNTER — Telehealth: Payer: Self-pay | Admitting: Orthopaedic Surgery

## 2018-10-26 ENCOUNTER — Encounter (HOSPITAL_COMMUNITY): Payer: Self-pay | Admitting: *Deleted

## 2018-10-26 NOTE — Telephone Encounter (Signed)
Received vm from patient stating he has authorization for [facility] for his upcoming STD claim when he has surgery and he wanted to know to whom to fax or email it to. IC him back and lmvm. Advised to email to me and I will scan into his chart.

## 2018-10-26 NOTE — Pre-Procedure Instructions (Signed)
Christopher Hill  10/26/2018      [id number] [place], [place] - [address] [place] [address] Phone: [phone] Fax: [phone]    Your procedure is scheduled on Tuesday, November 06, 2018  Report to [facility] Admitting at 1000 A.M.  Call this number if you have problems the morning of surgery:  [phone]   Remember:  You may drink clear liquids until 0430 AM.  Clear liquids allowed are:                    Water, Juice (non-citric and without pulp), Carbonated beverages, Clear Tea, Black Coffee only, Plain Jell-O only, Gatorade and Plain Popsicles only   **Drink the bottle of Ensure pre-surgery at 4:30am. Nothing to eat or drink after 4:30am**   Take these medicines the morning of surgery with A SIP OF WATER Diclofenac (Voltaren) Fexofenadine (Allegra)  You may also use your Fluticasone (Flonase) nasal spray, Lotemax SM gel eye drops, and Olopatadine eye drops if needed.   7 days prior to surgery STOP taking any Aspirin (unless otherwise instructed by your surgeon), Aleve, Naproxen, Ibuprofen, Motrin, Advil, Goody's, BC's, all herbal medications, fish oil, and all vitamins.    Do not wear jewelry.  Do not wear lotions, powders, cologne, or deodorant.  Men may shave face and neck.  Do not bring valuables to the hospital.  [facility] is not responsible for any belongings or valuables.  Contacts, dentures or bridgework may not be worn into surgery.  Leave your suitcase in the car.  After surgery it may be brought to your room.   [facility]- Preparing For Surgery  Before surgery, you can play an important role. Because skin is not sterile, your skin needs to be as free of germs as possible. You can reduce the number of germs on your skin by washing with CHG (chlorahexidine gluconate) Soap before surgery.  CHG is an antiseptic cleaner which kills germs and bonds with the skin to continue  killing germs even after washing.    Oral Hygiene is also important to reduce your risk of infection.  Remember - BRUSH YOUR TEETH THE MORNING OF SURGERY WITH YOUR REGULAR TOOTHPASTE  Please do not use if you have an allergy to CHG or antibacterial soaps. If your skin becomes reddened/irritated stop using the CHG.  Do not shave (including legs and underarms) for at least 48 hours prior to first CHG shower. It is OK to shave your face.  Please follow these instructions carefully.   1. Shower the NIGHT BEFORE SURGERY and the MORNING OF SURGERY with CHG.   2. If you chose to wash your hair, wash your hair first as usual with your normal shampoo.  3. After you shampoo, rinse your hair and body thoroughly to remove the shampoo.  4. Use CHG as you would any other liquid soap. You can apply CHG directly to the skin and wash gently with a scrungie or a clean washcloth.   5. Apply the CHG Soap to your body ONLY FROM THE NECK DOWN.  Do not use on open wounds or open sores. Avoid contact with your eyes, ears, mouth and genitals (private parts). Wash Face and genitals (private parts)  with your normal soap.  6. Wash thoroughly, paying special attention to the area where your surgery will be performed.  7. Thoroughly rinse your body with warm water from the neck  down.  8. DO NOT shower/wash with your normal soap after using and rinsing off the CHG Soap.  9. Pat yourself dry with a CLEAN TOWEL.  10. Wear CLEAN PAJAMAS to bed the night before surgery, wear comfortable clothes the morning of surgery  11. Place CLEAN SHEETS on your bed the night of your first shower and DO NOT SLEEP WITH PETS.    Day of Surgery:  Do not apply any deodorants/lotions.  Please wear clean clothes to the hospital/surgery center.   Remember to brush your teeth WITH YOUR REGULAR TOOTHPASTE.  For patients admitted to the hospital, discharge time will be determined by your treatment team.  Patients discharged the day  of surgery will not be allowed to drive home.

## 2018-10-30 ENCOUNTER — Encounter (HOSPITAL_COMMUNITY)
Admission: RE | Admit: 2018-10-30 | Discharge: 2018-10-30 | Disposition: A | Discharge status: Home / Self Care | Payer: BLUE CROSS/BLUE SHIELD | Source: Ambulatory Visit | Attending: Orthopaedic Surgery | Admitting: Orthopaedic Surgery

## 2018-10-30 ENCOUNTER — Other Ambulatory Visit: Payer: Self-pay

## 2018-10-30 ENCOUNTER — Encounter (HOSPITAL_COMMUNITY): Payer: Self-pay

## 2018-10-30 DIAGNOSIS — Z01818 Encounter for other preprocedural examination: Secondary | ICD-10-CM | POA: Insufficient documentation

## 2018-10-30 DIAGNOSIS — Z79899 Other long term (current) drug therapy: Secondary | ICD-10-CM | POA: Insufficient documentation

## 2018-10-30 DIAGNOSIS — Z87442 Personal history of urinary calculi: Secondary | ICD-10-CM | POA: Insufficient documentation

## 2018-10-30 DIAGNOSIS — Z7951 Long term (current) use of inhaled steroids: Secondary | ICD-10-CM | POA: Insufficient documentation

## 2018-10-30 DIAGNOSIS — Z856 Personal history of leukemia: Secondary | ICD-10-CM | POA: Diagnosis not present

## 2018-10-30 DIAGNOSIS — M1612 Unilateral primary osteoarthritis, left hip: Secondary | ICD-10-CM | POA: Diagnosis not present

## 2018-10-30 LAB — CBC
HCT: 43.3 % (ref 39.0–52.0)
Hemoglobin: 13.8 g/dL (ref 13.0–17.0)
MCH: 30.1 pg (ref 26.0–34.0)
MCHC: 31.9 g/dL (ref 30.0–36.0)
MCV: 94.3 fL (ref 80.0–100.0)
Platelets: 234 10*3/uL (ref 150–400)
RBC: 4.59 MIL/uL (ref 4.22–5.81)
RDW: 12.7 % (ref 11.5–15.5)
WBC: 19.8 10*3/uL — ABNORMAL HIGH (ref 4.0–10.5)
nRBC: 0 % (ref 0.0–0.2)

## 2018-10-30 LAB — BASIC METABOLIC PANEL
Anion gap: 10 (ref 5–15)
BUN: 20 mg/dL (ref 6–20)
CO2: 24 mmol/L (ref 22–32)
Calcium: 9.7 mg/dL (ref 8.9–10.3)
Chloride: 108 mmol/L (ref 98–111)
Creatinine, Ser: 0.85 mg/dL (ref 0.61–1.24)
GFR calc Af Amer: >60 mL/min (ref >60)
GFR calc non Af Amer: >60 mL/min (ref >60)
Glucose, Bld: 108 mg/dL — ABNORMAL HIGH (ref 70–99)
Potassium: 4.5 mmol/L (ref 3.5–5.1)
Sodium: 142 mmol/L (ref 135–145)

## 2018-10-30 LAB — SURGICAL PCR SCREEN
MRSA, PCR: NEGATIVE
Staphylococcus aureus: NEGATIVE

## 2018-10-30 NOTE — Progress Notes (Addendum)
PCP - Deland Pretty, MD Cardiologist - n/a  Chest x-ray - pt denies past year-no symptoms EKG - pt denies past year  Stress Test - pt denies ECHO - pt denies  Cardiac Cath - pt denies  Sleep Study -  Never tested CPAP - n/a  Fasting Blood Sugar - n/a Checks Blood Sugar _____ times a day-n/a  Blood Thinner Instructions: n/a Aspirin Instructions: n/a  Anesthesia review:  Pending labs  **Patient has CLL so his WBC is always elevated**  Patient denies shortness of breath, fever, cough and chest pain at PAT appointment  Patient verbalized understanding of instructions that were given to them at the PAT appointment. Patient was also instructed that they will need to review over the PAT instructions again at home before surgery.   Coronavirus Screening  Have you experienced the following symptoms:  Cough yes/no: No Fever (>100.60F)  yes/no: No Runny nose yes/no: No Sore throat yes/no: No Difficulty breathing/shortness of breath  yes/no: No  Have you or a family member traveled in the last 14 days and where? yes/no: No   If the patient is not experiencing any of these symptoms, the PAT nurse will instruct them to NOT bring anyone with them to their appointment since they may have these symptoms or traveled as well.   Please remind your patients and families that hospital visitation restrictions are in effect and the importance of the restrictions.

## 2018-10-30 NOTE — Progress Notes (Addendum)
Christopher Hill            10/26/2018                          [id number] - [place], [place] - [address] AT [address] [place] [address] Phone: [phone] Fax: [phone]              Your procedure is scheduled on Tuesday, November 06, 2018            Report to [facility] Admitting at 1000 AM.            Call this number if you have problems the morning of surgery:            [phone]             Remember: DO NOT EAT AFTER MIDNIGHT            You may drink clear liquids until 0430 AM.  Clear liquids allowed are:  Ensure Pre-surgery drink,   Water, Juice (non-citric and without pulp), Carbonated beverages, Clear Tea, Black Coffee only, Plain Jell-O only, Gatorade and Plain Popsicles only             **Drink the bottle of Ensure pre-surgery at 4:30am. Nothing to eat or drink after 4:30am**             Take these medicines the morning of surgery with A SIP OF WATER Fexofenadine (Allegra)  You may also use your Fluticasone (Flonase) nasal spray, Lotemax SM gel eye drops, and Olopatadine eye drops if needed.   7 days prior to surgery STOP taking any Aspirin (unless otherwise instructed by your surgeon), NSAIDS: Diclofenac (voltaren)  Aleve, Naproxen, Ibuprofen, Motrin, Advil, Goody's, BC's, all herbal medications, fish oil, and all vitamins.                        Do not wear jewelry.            Do not wear lotions, powders, cologne, or deodorant.            Men may shave face and neck.            Do not bring valuables to the hospital.            [facility] is not responsible for any belongings or valuables.  Contacts, dentures or bridgework may not be worn into surgery.  Leave your suitcase in the car.  After surgery it may be brought to your room.   [facility]- Preparing For Surgery  Before surgery, you can play an important role. Because skin is not sterile, your skin needs to be as free of  germs as possible. You can reduce the number of germs on your skin by washing with CHG (chlorahexidine gluconate) Soap before surgery.  CHG is an antiseptic cleaner which kills germs and bonds with the skin to continue killing germs even after washing.    Oral Hygiene is also important to reduce your risk of infection.  Remember - BRUSH YOUR TEETH THE MORNING OF SURGERY WITH YOUR REGULAR TOOTHPASTE  Please do not use if you have an allergy to CHG or antibacterial soaps. If your skin becomes reddened/irritated stop using the CHG.  Do not shave (including legs and underarms) for at least 48 hours prior to first CHG shower. It is OK to shave  your face.  Please follow these instructions carefully.                                                                                                                     1. Shower the NIGHT BEFORE SURGERY and the MORNING OF SURGERY with CHG.   2. If you chose to wash your hair, wash your hair first as usual with your normal shampoo.  3. After you shampoo, rinse your hair and body thoroughly to remove the shampoo.  4. Use CHG as you would any other liquid soap. You can apply CHG directly to the skin and wash gently with a scrungie or a clean washcloth.   5. Apply the CHG Soap to your body ONLY FROM THE NECK DOWN.  Do not use on open wounds or open sores. Avoid contact with your eyes, ears, mouth and genitals (private parts). Wash Face and genitals (private parts)  with your normal soap.  6. Wash thoroughly, paying special attention to the area where your surgery will be performed.  7. Thoroughly rinse your body with warm water from the neck down.  8. DO NOT shower/wash with your normal soap after using and rinsing off the CHG Soap.  9. Pat yourself dry with a CLEAN TOWEL.  10. Wear CLEAN PAJAMAS to bed the night before surgery, wear comfortable clothes the morning of surgery  11. Place CLEAN SHEETS on your bed the night of your first  shower and DO NOT SLEEP WITH PETS.    Day of Surgery:  Do not apply any deodorants/lotions.  Please wear clean clothes to the hospital/surgery center.   Remember to brush your teeth WITH YOUR REGULAR TOOTHPASTE.  For patients admitted to the hospital, discharge time will be determined by your treatment team.  Patients discharged the day of surgery will not be allowed to drive home

## 2018-10-31 NOTE — Anesthesia Preprocedure Evaluation (Addendum)
Anesthesia Evaluation  Patient identified by MRN, date of birth, ID band Patient awake    Reviewed: Allergy & Precautions, NPO status , Patient's Chart, lab work & pertinent test results  Airway Mallampati: I  TM Distance: >3 FB Neck ROM: Full    Dental  (+) Teeth Intact, Dental Advisory Given   Pulmonary neg pulmonary ROS,    breath sounds clear to auscultation       Cardiovascular negative cardio ROS   Rhythm:Regular Rate:Normal     Neuro/Psych negative neurological ROS  negative psych ROS   GI/Hepatic negative GI ROS, Neg liver ROS,   Endo/Other  negative endocrine ROS  Renal/GU negative Renal ROS     Musculoskeletal  (+) Arthritis ,   Abdominal Normal abdominal exam  (+)   Peds  Hematology negative hematology ROS (+)   Anesthesia Other Findings   Reproductive/Obstetrics                            Lab Results  Component Value Date   WBC 19.8 (H) 10/30/2018   HGB 13.8 10/30/2018   HCT 43.3 10/30/2018   MCV 94.3 10/30/2018   PLT 234 10/30/2018     Anesthesia Physical Anesthesia Plan  ASA: II  Anesthesia Plan: Spinal   Post-op Pain Management:    Induction: Intravenous  PONV Risk Score and Plan: 2 and Ondansetron, Propofol infusion and Midazolam  Airway Management Planned: Simple Face Mask and Natural Airway  Additional Equipment: None  Intra-op Plan:   Post-operative Plan:   Informed Consent: I have reviewed the patients History and Physical, chart, labs and discussed the procedure including the risks, benefits and alternatives for the proposed anesthesia with the patient or authorized representative who has indicated his/her understanding and acceptance.     Dental advisory given  Plan Discussed with: CRNA  Anesthesia Plan Comments: (PAT note written 10/31/2018 by Myra Gianotti, PA-C. History of CLL. )      Anesthesia Quick Evaluation

## 2018-10-31 NOTE — Progress Notes (Signed)
Anesthesia Chart Review:  Case:  [id number] Date/Time:  11/06/18 1144   Procedure:  LEFT TOTAL HIP ARTHROPLASTY ANTERIOR APPROACH (Left )   Anesthesia type:  Spinal   Pre-op diagnosis:  Osteoarthritis left hip   Location:  [facility] OR ROOM 04 / [facility]   Surgeon:  Mcarthur Rossetti, MD      DISCUSSION: Patient is a 58 year old male scheduled for the above procedure.  History includes never smoker, CLL, stage 0 (diagnosed 2012, continued observation recommended 08/06/18).    He denied chest pain, cough, SOB, fever at PAT RN visit. [place] Performance Status as of 08/06/18: 0 - Fully active; no performance restrictions.   Preoperative labs show WBC elevated at 19.8 (WBC 16.0 at 08/06/18 oncology follow-up with range of 15.4-19.2 since at least 05/07/14 with known CLL). Otherwise labs unremarkable. Likely leukocytosis is related to CLL. Surgeon has reviewed and no plans to repeat preoperatively, but will follow in his post-operative course.   If no acute changes then I anticipate that he can proceed as planned.   VS: BP (!) 156/84   Pulse 67   Temp 36.7 C   Resp 20   Ht 5' 10.5" (1.791 m)   Wt 95.9 kg   SpO2 98%   BMI 29.92 kg/m     PROVIDERS: Deland Pretty, MD is PCP Phill Myron, MD is HEM-ONC. Last visit 08/06/18 ([place]).   LABS: Preoperative labs noted and discussed above. (all labs ordered are listed, but only abnormal results are displayed)  Labs Reviewed  BASIC METABOLIC PANEL - Abnormal; Notable for the following components:      Result Value   Glucose, Bld 108 (*)    All other components within normal limits  CBC - Abnormal; Notable for the following components:   WBC 19.8 (*)    All other components within normal limits  SURGICAL PCR SCREEN    EKG: N/A   CV: N/A  Past Medical History:  Diagnosis Date  . Allergic rhinitis, cause unspecified   . Chronic lymphoblastic leukemia   . CLL (chronic lymphocytic leukemia) ([place]) 2014  . History of kidney  stones   . Other chronic allergic conjunctivitis     Past Surgical History:  Procedure Laterality Date  . CATARACT EXTRACTION    . COLONOSCOPY    . LITHOTRIPSY  1997, 1995  . TONSILLECTOMY  1964    MEDICATIONS: . atorvastatin (LIPITOR) 20 MG tablet  . clobetasol (TEMOVATE) 0.05 % external solution  . diclofenac (VOLTAREN) 75 MG EC tablet  . fexofenadine (ALLEGRA) 180 MG tablet  . fluticasone (FLONASE) 50 MCG/ACT nasal spray  . LOTEMAX SM 0.38 % GEL  . Multiple Vitamin (MULTIVITAMIN) tablet  . Olopatadine HCl 0.2 % SOLN   . methylPREDNISolone acetate (DEPO-MEDROL) injection 40 mg    Myra Gianotti, PA-C Surgical Short Stay/Anesthesiology [facility] Phone [phone] [facility] Phone [phone] 10/31/2018 1:38 PM

## 2018-11-02 ENCOUNTER — Other Ambulatory Visit (HOSPITAL_COMMUNITY)
Admission: RE | Admit: 2018-11-02 | Discharge: 2018-11-02 | Disposition: A | Discharge status: Home / Self Care | Payer: BLUE CROSS/BLUE SHIELD | Source: Ambulatory Visit | Attending: Orthopaedic Surgery | Admitting: Orthopaedic Surgery

## 2018-11-02 DIAGNOSIS — Z1159 Encounter for screening for other viral diseases: Secondary | ICD-10-CM | POA: Insufficient documentation

## 2018-11-03 LAB — NOVEL CORONAVIRUS, NAA (HOSP ORDER, SEND-OUT TO REF LAB; TAT 18-24 HRS): SARS-CoV-2, NAA: NOT DETECTED

## 2018-11-05 MED ORDER — TRANEXAMIC ACID-NACL 1000-0.7 MG/100ML-% IV SOLN
1000.0000 mg | INTRAVENOUS | Status: AC
  Administered 2018-11-06: 1000 mg via INTRAVENOUS
  Filled 2018-11-05: qty 100

## 2018-11-06 ENCOUNTER — Inpatient Hospital Stay (HOSPITAL_COMMUNITY)
Admission: RE | Admit: 2018-11-06 | Discharge: 2018-11-08 | DRG: 470 | Disposition: A | Discharge status: Home / Self Care | Payer: BC Managed Care – PPO | Attending: Orthopaedic Surgery | Admitting: Orthopaedic Surgery

## 2018-11-06 ENCOUNTER — Encounter (HOSPITAL_COMMUNITY): Payer: Self-pay | Admitting: Anesthesiology

## 2018-11-06 ENCOUNTER — Inpatient Hospital Stay (HOSPITAL_COMMUNITY): Payer: BC Managed Care – PPO | Admitting: Vascular Surgery

## 2018-11-06 ENCOUNTER — Other Ambulatory Visit: Payer: Self-pay

## 2018-11-06 ENCOUNTER — Inpatient Hospital Stay (HOSPITAL_COMMUNITY): Payer: BC Managed Care – PPO

## 2018-11-06 ENCOUNTER — Inpatient Hospital Stay (HOSPITAL_COMMUNITY): Payer: BC Managed Care – PPO | Admitting: Certified Registered"

## 2018-11-06 ENCOUNTER — Encounter (HOSPITAL_COMMUNITY)
Admission: RE | Disposition: A | Discharge status: Home / Self Care | Payer: Self-pay | Source: Home / Self Care | Attending: Orthopaedic Surgery

## 2018-11-06 DIAGNOSIS — M1612 Unilateral primary osteoarthritis, left hip: Principal | ICD-10-CM | POA: Diagnosis present

## 2018-11-06 DIAGNOSIS — J301 Allergic rhinitis due to pollen: Secondary | ICD-10-CM | POA: Diagnosis not present

## 2018-11-06 DIAGNOSIS — H1010 Acute atopic conjunctivitis, unspecified eye: Secondary | ICD-10-CM | POA: Diagnosis not present

## 2018-11-06 DIAGNOSIS — Z96642 Presence of left artificial hip joint: Secondary | ICD-10-CM | POA: Diagnosis not present

## 2018-11-06 DIAGNOSIS — M1712 Unilateral primary osteoarthritis, left knee: Secondary | ICD-10-CM | POA: Diagnosis not present

## 2018-11-06 DIAGNOSIS — C9110 Chronic lymphocytic leukemia of B-cell type not having achieved remission: Secondary | ICD-10-CM | POA: Diagnosis present

## 2018-11-06 DIAGNOSIS — Z419 Encounter for procedure for purposes other than remedying health state, unspecified: Secondary | ICD-10-CM

## 2018-11-06 DIAGNOSIS — Z471 Aftercare following joint replacement surgery: Secondary | ICD-10-CM | POA: Diagnosis not present

## 2018-11-06 HISTORY — DX: Personal history of urinary calculi: Z87.442

## 2018-11-06 HISTORY — PX: TOTAL HIP ARTHROPLASTY: SHX124

## 2018-11-06 SURGERY — ARTHROPLASTY, HIP, TOTAL, ANTERIOR APPROACH
Anesthesia: Spinal | Site: Hip | Laterality: Left

## 2018-11-06 MED ORDER — 0.9 % SODIUM CHLORIDE (POUR BTL) OPTIME
TOPICAL | Status: DC | PRN
  Administered 2018-11-06: 1000 mL

## 2018-11-06 MED ORDER — ATORVASTATIN CALCIUM 10 MG PO TABS
20.0000 mg | ORAL_TABLET | Freq: Every day | ORAL | Status: DC
  Administered 2018-11-06 – 2018-11-07 (×2): 20 mg via ORAL
  Filled 2018-11-06 (×2): qty 2

## 2018-11-06 MED ORDER — DIPHENHYDRAMINE HCL 12.5 MG/5ML PO ELIX: 12.5000 mg | ORAL_SOLUTION | ORAL | Status: DC | PRN

## 2018-11-06 MED ORDER — POVIDONE-IODINE 10 % EX SWAB: 2.0000 "application " | Freq: Once | CUTANEOUS | Status: DC

## 2018-11-06 MED ORDER — PANTOPRAZOLE SODIUM 40 MG PO TBEC
40.0000 mg | DELAYED_RELEASE_TABLET | Freq: Every day | ORAL | Status: DC
  Administered 2018-11-06 – 2018-11-08 (×3): 40 mg via ORAL
  Filled 2018-11-06 (×3): qty 1

## 2018-11-06 MED ORDER — MEPERIDINE HCL 25 MG/ML IJ SOLN: 6.2500 mg | INTRAMUSCULAR | Status: DC | PRN

## 2018-11-06 MED ORDER — ONDANSETRON HCL 4 MG/2ML IJ SOLN
INTRAMUSCULAR | Status: AC
  Filled 2018-11-06: qty 2

## 2018-11-06 MED ORDER — METHOCARBAMOL 1000 MG/10ML IJ SOLN
500.0000 mg | Freq: Four times a day (QID) | INTRAVENOUS | Status: DC | PRN
  Filled 2018-11-06: qty 5

## 2018-11-06 MED ORDER — GABAPENTIN 100 MG PO CAPS
100.0000 mg | ORAL_CAPSULE | Freq: Three times a day (TID) | ORAL | Status: DC
  Administered 2018-11-06 – 2018-11-08 (×5): 100 mg via ORAL
  Filled 2018-11-06 (×5): qty 1

## 2018-11-06 MED ORDER — PHENOL 1.4 % MT LIQD: 1.0000 | OROMUCOSAL | Status: DC | PRN

## 2018-11-06 MED ORDER — DIPHENHYDRAMINE HCL 50 MG/ML IJ SOLN
INTRAMUSCULAR | Status: AC
  Filled 2018-11-06: qty 1

## 2018-11-06 MED ORDER — OXYCODONE HCL 5 MG PO TABS
10.0000 mg | ORAL_TABLET | ORAL | Status: DC | PRN
  Administered 2018-11-06: 15 mg via ORAL

## 2018-11-06 MED ORDER — FENTANYL CITRATE (PF) 100 MCG/2ML IJ SOLN
INTRAMUSCULAR | Status: DC | PRN
  Administered 2018-11-06 (×2): 50 ug via INTRAVENOUS

## 2018-11-06 MED ORDER — METHOCARBAMOL 500 MG PO TABS
ORAL_TABLET | ORAL | Status: AC
  Filled 2018-11-06: qty 1

## 2018-11-06 MED ORDER — ACETAMINOPHEN 325 MG PO TABS
325.0000 mg | ORAL_TABLET | Freq: Four times a day (QID) | ORAL | Status: DC | PRN
  Administered 2018-11-07 – 2018-11-08 (×3): 650 mg via ORAL
  Filled 2018-11-06 (×3): qty 2

## 2018-11-06 MED ORDER — ADULT MULTIVITAMIN W/MINERALS CH
1.0000 | ORAL_TABLET | Freq: Every day | ORAL | Status: DC
  Administered 2018-11-06 – 2018-11-08 (×3): 1 via ORAL
  Filled 2018-11-06 (×3): qty 1

## 2018-11-06 MED ORDER — METHOCARBAMOL 500 MG PO TABS
500.0000 mg | ORAL_TABLET | Freq: Four times a day (QID) | ORAL | Status: DC | PRN
  Administered 2018-11-06 – 2018-11-08 (×3): 500 mg via ORAL
  Filled 2018-11-06 (×3): qty 1

## 2018-11-06 MED ORDER — HYDROMORPHONE HCL 1 MG/ML IJ SOLN: 0.2500 mg | INTRAMUSCULAR | Status: DC | PRN

## 2018-11-06 MED ORDER — MIDAZOLAM HCL 2 MG/2ML IJ SOLN
INTRAMUSCULAR | Status: AC
  Filled 2018-11-06: qty 2

## 2018-11-06 MED ORDER — PHENYLEPHRINE 40 MCG/ML (10ML) SYRINGE FOR IV PUSH (FOR BLOOD PRESSURE SUPPORT)
PREFILLED_SYRINGE | INTRAVENOUS | Status: AC
  Filled 2018-11-06: qty 10

## 2018-11-06 MED ORDER — ONDANSETRON HCL 4 MG PO TABS: 4.0000 mg | ORAL_TABLET | Freq: Four times a day (QID) | ORAL | Status: DC | PRN

## 2018-11-06 MED ORDER — OXYCODONE HCL 5 MG PO TABS
ORAL_TABLET | ORAL | Status: AC
  Filled 2018-11-06: qty 2

## 2018-11-06 MED ORDER — ASPIRIN 81 MG PO CHEW
81.0000 mg | CHEWABLE_TABLET | Freq: Two times a day (BID) | ORAL | Status: DC
  Administered 2018-11-06 – 2018-11-08 (×4): 81 mg via ORAL
  Filled 2018-11-06 (×4): qty 1

## 2018-11-06 MED ORDER — DOCUSATE SODIUM 100 MG PO CAPS
100.0000 mg | ORAL_CAPSULE | Freq: Two times a day (BID) | ORAL | Status: DC
  Administered 2018-11-06 – 2018-11-08 (×4): 100 mg via ORAL
  Filled 2018-11-06 (×4): qty 1

## 2018-11-06 MED ORDER — HYDROMORPHONE HCL 1 MG/ML IJ SOLN
0.5000 mg | INTRAMUSCULAR | Status: DC | PRN
  Administered 2018-11-06 – 2018-11-07 (×2): 1 mg via INTRAVENOUS
  Filled 2018-11-06 (×2): qty 1

## 2018-11-06 MED ORDER — LOTEPREDNOL ETABONATE 0.5 % OP SUSP
1.0000 [drp] | Freq: Every day | OPHTHALMIC | Status: DC
  Filled 2018-11-06 (×2): qty 5

## 2018-11-06 MED ORDER — BUPIVACAINE IN DEXTROSE 0.75-8.25 % IT SOLN
INTRATHECAL | Status: DC | PRN
  Administered 2018-11-06: 2 mL via INTRATHECAL

## 2018-11-06 MED ORDER — SCOPOLAMINE 1 MG/3DAYS TD PT72
MEDICATED_PATCH | TRANSDERMAL | Status: AC
  Filled 2018-11-06: qty 1

## 2018-11-06 MED ORDER — POLYETHYLENE GLYCOL 3350 17 G PO PACK: 17.0000 g | PACK | Freq: Every day | ORAL | Status: DC | PRN

## 2018-11-06 MED ORDER — ALUM & MAG HYDROXIDE-SIMETH 200-200-20 MG/5ML PO SUSP: 30.0000 mL | ORAL | Status: DC | PRN

## 2018-11-06 MED ORDER — OXYCODONE HCL 5 MG PO TABS
5.0000 mg | ORAL_TABLET | ORAL | Status: DC | PRN
  Administered 2018-11-06 – 2018-11-08 (×6): 10 mg via ORAL
  Filled 2018-11-06 (×6): qty 2

## 2018-11-06 MED ORDER — METOCLOPRAMIDE HCL 5 MG PO TABS: 5.0000 mg | ORAL_TABLET | Freq: Three times a day (TID) | ORAL | Status: DC | PRN

## 2018-11-06 MED ORDER — TAMSULOSIN HCL 0.4 MG PO CAPS
0.4000 mg | ORAL_CAPSULE | Freq: Every day | ORAL | Status: DC
  Filled 2018-11-06 (×2): qty 1

## 2018-11-06 MED ORDER — SODIUM CHLORIDE 0.9 % IR SOLN
Status: DC | PRN
  Administered 2018-11-06: 3000 mL

## 2018-11-06 MED ORDER — CEFAZOLIN SODIUM-DEXTROSE 2-4 GM/100ML-% IV SOLN
2.0000 g | INTRAVENOUS | Status: AC
  Administered 2018-11-06: 2 g via INTRAVENOUS
  Filled 2018-11-06: qty 100

## 2018-11-06 MED ORDER — SODIUM CHLORIDE 0.9 % IV SOLN
INTRAVENOUS | Status: DC | PRN
  Administered 2018-11-06: 20 ug/min via INTRAVENOUS

## 2018-11-06 MED ORDER — CEFAZOLIN SODIUM-DEXTROSE 1-4 GM/50ML-% IV SOLN
1.0000 g | Freq: Four times a day (QID) | INTRAVENOUS | Status: DC
  Administered 2018-11-06: 1 g via INTRAVENOUS
  Filled 2018-11-06: qty 50

## 2018-11-06 MED ORDER — PHENYLEPHRINE 40 MCG/ML (10ML) SYRINGE FOR IV PUSH (FOR BLOOD PRESSURE SUPPORT)
PREFILLED_SYRINGE | INTRAVENOUS | Status: DC | PRN
  Administered 2018-11-06: 80 ug via INTRAVENOUS

## 2018-11-06 MED ORDER — ACETAMINOPHEN 10 MG/ML IV SOLN
1000.0000 mg | Freq: Once | INTRAVENOUS | Status: DC | PRN
  Administered 2018-11-06: 1000 mg via INTRAVENOUS

## 2018-11-06 MED ORDER — ACETAMINOPHEN 160 MG/5ML PO SOLN: 325.0000 mg | Freq: Once | ORAL | Status: DC | PRN

## 2018-11-06 MED ORDER — ACETAMINOPHEN 10 MG/ML IV SOLN
INTRAVENOUS | Status: AC
  Filled 2018-11-06: qty 100

## 2018-11-06 MED ORDER — OXYCODONE HCL 5 MG PO TABS
ORAL_TABLET | ORAL | Status: AC
  Filled 2018-11-06: qty 3

## 2018-11-06 MED ORDER — LACTATED RINGERS IV SOLN
INTRAVENOUS | Status: DC
  Administered 2018-11-06 (×2): via INTRAVENOUS

## 2018-11-06 MED ORDER — ONDANSETRON HCL 4 MG/2ML IJ SOLN: 4.0000 mg | Freq: Four times a day (QID) | INTRAMUSCULAR | Status: DC | PRN

## 2018-11-06 MED ORDER — MENTHOL 3 MG MT LOZG: 1.0000 | LOZENGE | OROMUCOSAL | Status: DC | PRN

## 2018-11-06 MED ORDER — FLUTICASONE PROPIONATE 50 MCG/ACT NA SUSP
2.0000 | Freq: Every day | NASAL | Status: DC
  Administered 2018-11-07 – 2018-11-08 (×2): 2 via NASAL
  Filled 2018-11-06 (×2): qty 16

## 2018-11-06 MED ORDER — FENTANYL CITRATE (PF) 250 MCG/5ML IJ SOLN
INTRAMUSCULAR | Status: AC
  Filled 2018-11-06: qty 5

## 2018-11-06 MED ORDER — MIDAZOLAM HCL 5 MG/5ML IJ SOLN
INTRAMUSCULAR | Status: DC | PRN
  Administered 2018-11-06: 2 mg via INTRAVENOUS

## 2018-11-06 MED ORDER — LACTATED RINGERS IV SOLN: INTRAVENOUS | Status: DC

## 2018-11-06 MED ORDER — ACETAMINOPHEN 325 MG PO TABS: 325.0000 mg | ORAL_TABLET | Freq: Once | ORAL | Status: DC | PRN

## 2018-11-06 MED ORDER — PROPOFOL 10 MG/ML IV BOLUS
INTRAVENOUS | Status: AC
  Filled 2018-11-06: qty 20

## 2018-11-06 MED ORDER — SODIUM CHLORIDE 0.9 % IV SOLN
INTRAVENOUS | Status: DC
  Administered 2018-11-06: 15:00:00 via INTRAVENOUS

## 2018-11-06 MED ORDER — ONDANSETRON HCL 4 MG/2ML IJ SOLN
INTRAMUSCULAR | Status: DC | PRN
  Administered 2018-11-06: 4 mg via INTRAVENOUS

## 2018-11-06 MED ORDER — METOCLOPRAMIDE HCL 5 MG/ML IJ SOLN: 5.0000 mg | Freq: Three times a day (TID) | INTRAMUSCULAR | Status: DC | PRN

## 2018-11-06 MED ORDER — PROMETHAZINE HCL 25 MG/ML IJ SOLN: 6.2500 mg | INTRAMUSCULAR | Status: DC | PRN

## 2018-11-06 MED ORDER — DIPHENHYDRAMINE HCL 50 MG/ML IJ SOLN
25.0000 mg | Freq: Once | INTRAMUSCULAR | Status: AC
  Administered 2018-11-06: 25 mg via INTRAVENOUS

## 2018-11-06 MED ORDER — CHLORHEXIDINE GLUCONATE 4 % EX LIQD: 60.0000 mL | Freq: Once | CUTANEOUS | Status: DC

## 2018-11-06 MED ORDER — PROPOFOL 500 MG/50ML IV EMUL
INTRAVENOUS | Status: DC | PRN
  Administered 2018-11-06: 50 ug/kg/min via INTRAVENOUS

## 2018-11-06 SURGICAL SUPPLY — 53 items
BENZOIN TINCTURE PRP APPL 2/3 (GAUZE/BANDAGES/DRESSINGS) ×2 IMPLANT
BLADE CLIPPER SURG (BLADE) IMPLANT
BLADE SAW SGTL 18X1.27X75 (BLADE) ×2 IMPLANT
COVER SURGICAL LIGHT HANDLE (MISCELLANEOUS) ×2 IMPLANT
COVER WAND RF STERILE (DRAPES) ×2 IMPLANT
DRAPE C-ARM 42X72 X-RAY (DRAPES) ×2 IMPLANT
DRAPE STERI IOBAN 125X83 (DRAPES) ×2 IMPLANT
DRAPE U-SHAPE 47X51 STRL (DRAPES) ×6 IMPLANT
DRSG AQUACEL AG ADV 3.5X10 (GAUZE/BANDAGES/DRESSINGS) ×2 IMPLANT
DRSG XEROFORM 1X8 (GAUZE/BANDAGES/DRESSINGS) ×2 IMPLANT
DURAPREP 26ML APPLICATOR (WOUND CARE) ×2 IMPLANT
ELECT BLADE 4.0 EZ CLEAN MEGAD (MISCELLANEOUS) ×2
ELECT BLADE 6.5 EXT (BLADE) IMPLANT
ELECT REM PT RETURN 9FT ADLT (ELECTROSURGICAL) ×2
ELECTRODE BLDE 4.0 EZ CLN MEGD (MISCELLANEOUS) ×1 IMPLANT
ELECTRODE REM PT RTRN 9FT ADLT (ELECTROSURGICAL) ×1 IMPLANT
FACESHIELD WRAPAROUND (MASK) ×4 IMPLANT
GLOVE BIOGEL PI IND STRL 8 (GLOVE) ×2 IMPLANT
GLOVE BIOGEL PI INDICATOR 8 (GLOVE) ×2
GLOVE ECLIPSE 8.0 STRL XLNG CF (GLOVE) ×2 IMPLANT
GLOVE ORTHO TXT STRL SZ7.5 (GLOVE) ×4 IMPLANT
GOWN STRL REUS W/ TWL LRG LVL3 (GOWN DISPOSABLE) ×2 IMPLANT
GOWN STRL REUS W/ TWL XL LVL3 (GOWN DISPOSABLE) ×2 IMPLANT
GOWN STRL REUS W/TWL LRG LVL3 (GOWN DISPOSABLE) ×2
GOWN STRL REUS W/TWL XL LVL3 (GOWN DISPOSABLE) ×2
HANDPIECE INTERPULSE COAX TIP (DISPOSABLE) ×1
HEAD CERAMIC 36 PLUS5 (Hips) ×2 IMPLANT
KIT BASIN OR (CUSTOM PROCEDURE TRAY) ×2 IMPLANT
KIT TURNOVER KIT B (KITS) ×2 IMPLANT
LINER ACETAB NEUTRAL 36ID 520D (Liner) ×2 IMPLANT
MANIFOLD NEPTUNE II (INSTRUMENTS) ×2 IMPLANT
NS IRRIG 1000ML POUR BTL (IV SOLUTION) ×2 IMPLANT
PACK TOTAL JOINT (CUSTOM PROCEDURE TRAY) ×2 IMPLANT
PAD ARMBOARD 7.5X6 YLW CONV (MISCELLANEOUS) ×2 IMPLANT
PIN SECTOR W/GRIP ACE CUP 52MM (Hips) ×2 IMPLANT
SET HNDPC FAN SPRY TIP SCT (DISPOSABLE) ×1 IMPLANT
STAPLER VISISTAT 35W (STAPLE) IMPLANT
STEM FEMORAL SZ5 HIGH ACTIS (Nail) ×2 IMPLANT
STRIP CLOSURE SKIN 1/2X4 (GAUZE/BANDAGES/DRESSINGS) ×4 IMPLANT
SUT ETHIBOND NAB CT1 #1 30IN (SUTURE) ×2 IMPLANT
SUT MNCRL AB 4-0 PS2 18 (SUTURE) IMPLANT
SUT VIC AB 0 CT1 27 (SUTURE) ×1
SUT VIC AB 0 CT1 27XBRD ANBCTR (SUTURE) ×1 IMPLANT
SUT VIC AB 1 CT1 27 (SUTURE) ×1
SUT VIC AB 1 CT1 27XBRD ANBCTR (SUTURE) ×1 IMPLANT
SUT VIC AB 2-0 CT1 27 (SUTURE) ×1
SUT VIC AB 2-0 CT1 TAPERPNT 27 (SUTURE) ×1 IMPLANT
TOWEL OR 17X24 6PK STRL BLUE (TOWEL DISPOSABLE) ×2 IMPLANT
TOWEL OR 17X26 10 PK STRL BLUE (TOWEL DISPOSABLE) ×2 IMPLANT
TRAY CATH 16FR W/PLASTIC CATH (SET/KITS/TRAYS/PACK) IMPLANT
TRAY FOLEY W/BAG SLVR 16FR (SET/KITS/TRAYS/PACK) ×1
TRAY FOLEY W/BAG SLVR 16FR ST (SET/KITS/TRAYS/PACK) ×1 IMPLANT
WATER STERILE IRR 1000ML POUR (IV SOLUTION) ×4 IMPLANT

## 2018-11-06 NOTE — H&P (Signed)
TOTAL HIP ADMISSION H&P  Patient is admitted for left total hip arthroplasty.  Subjective:  Chief Complaint: left hip pain  HPI: Christopher Hill, 58 y.o. male, has a history of pain and functional disability in the left hip(s) due to arthritis and patient has failed non-surgical conservative treatments for greater than 12 weeks to include NSAID's and/or analgesics, corticosteriod injections, weight reduction as appropriate and activity modification.  Onset of symptoms was gradual starting 2 years ago with gradually worsening course since that time.The patient noted no past surgery on the left hip(s).  Patient currently rates pain in the left hip at 10 out of 10 with activity. Patient has night pain, worsening of pain with activity and weight bearing, trendelenberg gait, pain that interfers with activities of daily living, pain with passive range of motion and crepitus. Patient has evidence of subchondral sclerosis, periarticular osteophytes and joint space narrowing by imaging studies. This condition presents safety issues increasing the risk of falls.  There is no current active infection.  Patient Active Problem List   Diagnosis Date Noted  . Unilateral primary osteoarthritis, left hip 08/29/2018  . Chronic lymphoblastic leukemia 01/21/2011  . Allergic rhinitis due to pollen 08/16/2010  . CONJUNCTIVITIS, ALLERGIC 01/23/2008   Past Medical History:  Diagnosis Date  . Allergic rhinitis, cause unspecified   . Chronic lymphoblastic leukemia   . CLL (chronic lymphocytic leukemia) ([place]) 2014  . History of kidney stones   . Other chronic allergic conjunctivitis     Past Surgical History:  Procedure Laterality Date  . CATARACT EXTRACTION    . COLONOSCOPY    . LITHOTRIPSY  1997, 1995  . TONSILLECTOMY  1964    Current Facility-Administered Medications  Medication Dose Route Frequency Provider Last Rate Last Dose  . 0.9 % irrigation (POUR BTL)    PRN Mcarthur Rossetti, MD   1,000 mL at  11/06/18 1147  . ceFAZolin (ANCEF) IVPB 2g/100 mL premix  2 g Intravenous On Call to OR Pete Pelt, PA-C      . chlorhexidine (HIBICLENS) 4 % liquid 4 application  60 mL Topical Once Pete Pelt, PA-C      . lactated ringers infusion   Intravenous Continuous Effie Berkshire, MD 10 mL/hr at 11/06/18 1133    . povidone-iodine 10 % swab 2 application  2 application Topical Once Pete Pelt, PA-C      . sodium chloride irrigation 0.9 %    PRN Mcarthur Rossetti, MD   3,000 mL at 11/06/18 1148  . tranexamic acid (CYKLOKAPRON) IVPB 1,000 mg  1,000 mg Intravenous To OR Mcarthur Rossetti, MD       Allergies  Allergen Reactions  . Azithromycin Nausea And Vomiting    REACTION: GI upset    Social History   Tobacco Use  . Smoking status: Never Smoker  . Smokeless tobacco: Never Used  Substance Use Topics  . Alcohol use: No    Family History  Problem Relation Age of Onset  . Heart disease Mother        arterial  . Allergies Mother   . Otitis media Mother   . Stroke Father   . Other Father        CABG-valve replacement  . Coronary artery disease Father   . Diabetes Maternal Grandmother   . Heart disease Maternal Grandmother   . Lung cancer Maternal Grandmother   . Colon cancer Maternal Grandmother 82  . Prostate cancer Maternal Grandfather  Review of Systems  Musculoskeletal: Positive for joint pain.  All other systems reviewed and are negative.   Objective:  Physical Exam  Constitutional: He is oriented to person, place, and time. He appears well-developed and well-nourished.  HENT:  Head: Normocephalic and atraumatic.  Eyes: Pupils are equal, round, and reactive to light. EOM are normal.  Neck: Normal range of motion. Neck supple.  Cardiovascular: Normal rate and regular rhythm.  Respiratory: Effort normal and breath sounds normal.  GI: Soft. Bowel sounds are normal.  Musculoskeletal:     Left hip: He exhibits decreased range of motion,  decreased strength, tenderness and bony tenderness.  Neurological: He is alert and oriented to person, place, and time.  Skin: Skin is warm and dry.  Psychiatric: He has a normal mood and affect.    Vital signs in last 24 hours: Temp:  [98.3 F (36.8 C)] 98.3 F (36.8 C) (06/02 1030) Pulse Rate:  [78] 78 (06/02 1030) Resp:  [18] 18 (06/02 1030) BP: (154)/(78) 154/78 (06/02 1030) SpO2:  [97 %] 97 % (06/02 1030) Weight:  [95.9 kg] 95.9 kg (06/02 1103)  Labs:   Estimated body mass index is 29.92 kg/m as calculated from the following:   Height as of this encounter: 5' 10.5" (1.791 m).   Weight as of this encounter: 95.9 kg.   Imaging Review Plain radiographs demonstrate severe degenerative joint disease of the left hip(s). The bone quality appears to be excellent for age and reported activity level.      Assessment/Plan:  End stage arthritis, left hip(s)  The patient history, physical examination, clinical judgement of the provider and imaging studies are consistent with end stage degenerative joint disease of the left hip(s) and total hip arthroplasty is deemed medically necessary. The treatment options including medical management, injection therapy, arthroscopy and arthroplasty were discussed at length. The risks and benefits of total hip arthroplasty were presented and reviewed. The risks due to aseptic loosening, infection, stiffness, dislocation/subluxation,  thromboembolic complications and other imponderables were discussed.  The patient acknowledged the explanation, agreed to proceed with the plan and consent was signed. Patient is being admitted for inpatient treatment for surgery, pain control, PT, OT, prophylactic antibiotics, VTE prophylaxis, progressive ambulation and ADL's and discharge planning.The patient is planning to be discharged home with home health services    Patient's anticipated LOS is less than 2 midnights, meeting these requirements: - Younger than  66 - Lives within 1 hour of care - Has a competent adult at home to recover with post-op recover - NO history of  - Chronic pain requiring opiods  - Diabetes  - Coronary Artery Disease  - Heart failure  - Heart attack  - Stroke  - DVT/VTE  - Cardiac arrhythmia  - Respiratory Failure/COPD  - Renal failure  - Anemia  - Advanced Liver disease

## 2018-11-06 NOTE — Op Note (Signed)
NAME: Christopher Hill, Christopher Hill MEDICAL RECORD EX:[id number] ACCOUNT [id number] DATE OF BIRTH:[birth year] FACILITY: [facility] LOCATION: [facility] PHYSICIAN:Immaculate Crutcher Kerry Fort, MD  OPERATIVE REPORT  DATE OF PROCEDURE:  11/06/2018  PREOPERATIVE DIAGNOSIS:  Primary osteoarthritis and degenerative joint disease, left hip.  POSTOPERATIVE DIAGNOSIS:  Primary osteoarthritis and degenerative joint disease, left hip.  PROCEDURE:  Left total hip arthroplasty through a direct anterior approach.  IMPLANTS:  DePuy Sector Gription acetabular component size 52, size 36+0 neutral polyethylene liner, size 5 ACTIS femoral component with high offset, size 36+5 ceramic hip ball.  SURGEON:  Lind Guest. Ninfa Linden, MD  ASSISTANT:  Erskine Emery, PA-C  ANESTHESIA:  Spinal.  ANTIBIOTICS:  Two grams IV Ancef.  ESTIMATED BLOOD LOSS:  200 mL.  COMPLICATIONS:  None.  INDICATIONS:  The patient is a 58 year old patient well known to me.  He has debilitating arthritis involving his left hip.  This has been well diagnosed with clinical exam as well as x-rays.  He has tried and failed all forms of conservative treatment  for over 12 months.  At this point, given the debilitating left hip pain that he has as well as radiographic findings and the failure of conservative treatment, he does wish to proceed with a total hip arthroplasty.  His left hip pain is detrimentally  affecting his activities of daily living, his quality of life and his mobility, to the point he does wish to proceed with the surgery.  We talked at length about the risk of acute blood loss anemia, nerve and vessel injury, fracture, infection,  dislocation, DVT and implant failure.  We talked about the goals of being in decreased pain, improved mobility and overall improved quality of life.  DESCRIPTION OF PROCEDURE:  After informed consent was obtained and appropriate left hip was marked, he was brought to the operating room and sat up on a stretcher  where spinal anesthesia was then obtained.  He was then laid supine on the stretcher, and a  Foley catheter was placed.  We were able to get a good assessment of his leg length, and I felt his leg lengths preoperatively were equal.  We then placed him supine on the Hana fracture table with a perineal post in place and both legs in in-line  skeletal traction boot and no traction applied.  His left operative hip was prepped and draped with DuraPrep and sterile drapes.  A time-out was called, and he was identified as correct patient, correct left hip.  I then made an incision just inferior  and posterior to the anterior superior iliac spine and carried this obliquely down the leg.  I dissected down to the tensor fascia lata muscle, and the tensor fascia was then divided longitudinally to proceed with a direct anterior approach to the hip.   We identified and cauterized circumflex vessels.  I then identified the hip capsule, placed some curved retractors around the medial and lateral femoral capsule and femoral neck.  I then opened up the capsule in an L-type format, finding a moderate joint  effusion.  We placed curved retractors within the capsule in the medial and lateral femoral neck.  We then made our femoral neck cut just proximal to the lesser trochanter with an oscillating saw and completed this with an osteotome.  I placed a  corkscrew guide in the femoral head and removed the femoral head in its entirety and found a wide area devoid of cartilage really smoothed down and hard as a rock from his bone-on-bone arthritis.  I  then placed a bent Hohmann over the medial acetabular  rim and removed remnants of the acetabular labrum and other debris from the hip socket.  I then began reaming under direct visualization from a size 44 reamer in stepwise increments, going all the way up to a size 51.  With all reamers placed under  direct visualization, the last reamer was placed under direct fluoroscopy so I could  obtain my depth of reaming, my inclination and anteversion.  I then placed the real DePuy Sector Gription acetabular component size 52 and a 36+0 polyethylene liner  based on what we were seeing anatomically.  Attention was then turned to the femur.  With the leg externally rotated to 120 degrees, extended and adducted, I was able to place a Mueller retractor medially and a Hohman retractor behind the greater  trochanter, released the lateral joint capsule and used a box-cutting osteotome to enter the femoral canal and a rongeur to lateralize it.  Then we began broaching using the ACTIS broaching system from DePuy from the starter broach, then going from 0 in  stepwise increments up to a size 5.  With the size 5 in place, we trialed a standard offset femoral neck and a 36+1.5 hip ball.  We brought the leg back over and up, and with traction and internal rotation, reduced the pelvis.  It was stable, but I felt  like we needed just a little bit more offset in leg length.  We dislocated the hip and removed the trial components.  I then placed the real ACTIS femoral component size 5 with high offset, and we went with a 36+5 ceramic hip ball, reduced this in the  acetabulum, and I was pleased with the leg length, offset, range of motion and stability assessed under fluoroscopy and clinical exam.  We then irrigated the soft tissue with normal saline solution using pulsatile lavage.  We closed the joint capsule  with interrupted #1 Ethibond suture.  A running #1 Vicryl suture was used to close the tensor fascia, 0 Vicryl was used to close the deep tissue, and 2-0 Vicryl was used to close the subcutaneous tissue.  Staples were used to close the skin.  Xeroform  and an Aquacel dressing were applied.  He was then taken off the Hana table and taken to recovery room in stable condition.  All final counts were correct.  There were no complications noted.  Of note, Benita Stabile, PA-C, assisted the entire case.  His   assistance was crucial for facilitating all aspects of this case.  LN/NUANCE  D:11/06/2018 T:11/06/2018 JOB:[id number]

## 2018-11-06 NOTE — Anesthesia Procedure Notes (Signed)
Spinal  Patient location during procedure: OR Start time: 11/06/2018 12:16 PM End time: 11/06/2018 12:17 PM Staffing Anesthesiologist: Effie Berkshire, MD Performed: anesthesiologist  Preanesthetic Checklist Completed: patient identified, site marked, surgical consent, pre-op evaluation, timeout performed, IV checked, risks and benefits discussed and monitors and equipment checked Spinal Block Patient position: sitting Prep: site prepped and draped and DuraPrep Patient monitoring: heart rate, cardiac monitor, continuous pulse ox and blood pressure Approach: midline Location: L3-4 Injection technique: single-shot Needle Needle type: Sprotte  Needle gauge: 24 G Needle length: 9 cm Needle insertion depth: 10 cm Assessment Sensory level: T4 Additional Notes Patient tolerated well. No immediate complications.

## 2018-11-06 NOTE — Transfer of Care (Signed)
Immediate Anesthesia Transfer of Care Note  Patient: Christopher Hill  Procedure(s) Performed: LEFT TOTAL HIP ARTHROPLASTY ANTERIOR APPROACH (Left Hip)  Patient Location: PACU  Anesthesia Type:MAC combined with regional for post-op pain  Level of Consciousness: awake and patient cooperative  Airway & Oxygen Therapy: Patient Spontanous Breathing  Post-op Assessment: Report given to RN and Post -op Vital signs reviewed and stable  Post vital signs: Reviewed and stable  Last Vitals:  Vitals Value Taken Time  BP    Temp    Pulse 64 11/06/2018  2:05 PM  Resp 14 11/06/2018  2:05 PM  SpO2 99 % 11/06/2018  2:05 PM  Vitals shown include unvalidated device data.  Last Pain:  Vitals:   11/06/18 1103  PainSc: 7       Patients Stated Pain Goal: 4 (22/29/79 8921)  Complications: No apparent anesthesia complications

## 2018-11-06 NOTE — Brief Op Note (Signed)
11/06/2018  1:46 PM  PATIENT:  Christopher Hill  58 y.o. male  PRE-OPERATIVE DIAGNOSIS:  Osteoarthritis left hip  POST-OPERATIVE DIAGNOSIS:  Osteoarthritis left hip  PROCEDURE:  Procedure(s): LEFT TOTAL HIP ARTHROPLASTY ANTERIOR APPROACH (Left)  SURGEON:  Surgeon(s) and Role:    Mcarthur Rossetti, MD - Primary  PHYSICIAN ASSISTANT: Benita Stabile, PA-C  ANESTHESIA:   spinal  EBL:  200 mL   COUNTS:  YES  DICTATION: .Other Dictation: Dictation Number [id number]  PLAN OF CARE: Admit to inpatient   PATIENT DISPOSITION:  PACU - hemodynamically stable.   Delay start of Pharmacological VTE agent (>24hrs) due to surgical blood loss or risk of bleeding: no

## 2018-11-07 LAB — CBC
HCT: 43.7 % (ref 39.0–52.0)
Hemoglobin: 14.3 g/dL (ref 13.0–17.0)
MCH: 30.4 pg (ref 26.0–34.0)
MCHC: 32.7 g/dL (ref 30.0–36.0)
MCV: 93 fL (ref 80.0–100.0)
Platelets: 230 10*3/uL (ref 150–400)
RBC: 4.7 MIL/uL (ref 4.22–5.81)
RDW: 12.9 % (ref 11.5–15.5)
WBC: 28.5 10*3/uL — ABNORMAL HIGH (ref 4.0–10.5)
nRBC: 0 % (ref 0.0–0.2)

## 2018-11-07 LAB — BASIC METABOLIC PANEL
Anion gap: 11 (ref 5–15)
BUN: 10 mg/dL (ref 6–20)
CO2: 25 mmol/L (ref 22–32)
Calcium: 8.8 mg/dL — ABNORMAL LOW (ref 8.9–10.3)
Chloride: 99 mmol/L (ref 98–111)
Creatinine, Ser: 0.82 mg/dL (ref 0.61–1.24)
GFR calc Af Amer: >60 mL/min (ref >60)
GFR calc non Af Amer: >60 mL/min (ref >60)
Glucose, Bld: 115 mg/dL — ABNORMAL HIGH (ref 70–99)
Potassium: 4 mmol/L (ref 3.5–5.1)
Sodium: 135 mmol/L (ref 135–145)

## 2018-11-07 NOTE — Evaluation (Signed)
Physical Therapy Evaluation Patient Details Name: Christopher Hill MRN: [id number] DOB: 06/03/1961 Today's Date: 11/07/2018   History of Present Illness  Pt is a 58 y.o. male s/p elective L THA (direct anterior approach) on 11/06/18. PMH includes chronic lympocytic leukemia.  Clinical Impression  Pt presents with an overall decrease in functional mobility secondary to above. PTA, pt indep and lives with wife. Educ on precautions, positioning, therex, and importance of mobility. Today, pt able to amb 40' with RW and min guard for balance; moving fairly well although limited by LLE pain. Pt would benefit from continued acute PT services to maximize functional mobility and independence prior to d/c home.     Follow Up Recommendations Follow surgeon's recommendation for DC plan and follow-up therapies;Supervision for mobility/OOB    Equipment Recommendations  None recommended by PT    Recommendations for Other Services       Precautions / Restrictions Precautions Precautions: Fall Restrictions Weight Bearing Restrictions: Yes LLE Weight Bearing: Weight bearing as tolerated      Mobility  Bed Mobility Overal bed mobility: Needs Assistance Bed Mobility: Supine to Sit     Supine to sit: Min assist     General bed mobility comments: Increased time and effort, minA to assist LLE to EOB, pt also assisting LLE with BUEs  Transfers Overall transfer level: Needs assistance Equipment used: Rolling walker (2 wheeled) Transfers: Sit to/from Stand Sit to Stand: Min guard         General transfer comment: Cues for correct hand placement; poor eccentric control into sitting due to pain  Ambulation/Gait Ambulation/Gait assistance: Min guard Gait Distance (Feet): 40 Feet Assistive device: Rolling walker (2 wheeled) Gait Pattern/deviations: Step-to pattern;Decreased weight shift to left;Decreased dorsiflexion - left;Antalgic Gait velocity: Decreased Gait velocity interpretation: <1.31  ft/sec, indicative of household ambulator General Gait Details: Slow, antalgic gait with RW and min guard for balance. Pt initially scooting LLE, but eventually able to take complete steps with cues for sequencing with RW and increase LLE WBAT  Stairs            Wheelchair Mobility    Modified Rankin (Stroke Patients Only)       Balance Overall balance assessment: Needs assistance   Sitting balance-Leahy Scale: Good Sitting balance - Comments: Good balance sitting EOB, but unable to reach L foot due to pain     Standing balance-Leahy Scale: Fair Standing balance comment: Can static stand without UE support                             Pertinent Vitals/Pain Pain Assessment: Faces Faces Pain Scale: Hurts even more Pain Location: LLE Pain Descriptors / Indicators: Guarding;Burning Pain Intervention(s): Limited activity within patient's tolerance;Patient requesting pain meds-RN notified    Home Living Family/patient expects to be discharged to:: Private residence Living Arrangements: Spouse/significant other Available Help at Discharge: Family;Available 24 hours/day Type of Home: House Home Access: Stairs to enter Entrance Stairs-Rails: Psychiatric nurse of Steps: 3 Home Layout: One level Home Equipment: Walker - 2 wheels;Cane - single point;Toilet riser;Grab bars - tub/shower;Shower seat      Prior Function Level of Independence: Independent         Comments: Has been using RW at home for past 2 weeks due to pain. 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      Hand Dominance        Extremity/Trunk Assessment  Upper Extremity Assessment Upper Extremity Assessment: Overall WFL for tasks assessed    Lower Extremity Assessment Lower Extremity Assessment: LLE deficits/detail LLE Deficits / Details: s/p L THA; hip flex <3/5, knee flex/ext at least 3/5 LLE: Unable to fully assess due to pain LLE  Coordination: decreased gross motor       Communication   Communication: No difficulties  Cognition Arousal/Alertness: Awake/alert Behavior During Therapy: WFL for tasks assessed/performed Overall Cognitive Status: Within Functional Limits for tasks assessed                                        General Comments      Exercises General Exercises - Lower Extremity Long Arc Quad: AROM;Left;Seated   Assessment/Plan    PT Assessment Patient needs continued PT services  PT Problem List Decreased strength;Decreased range of motion;Decreased balance;Decreased activity tolerance;Decreased mobility;Decreased knowledge of use of DME;Pain;Decreased knowledge of precautions       PT Treatment Interventions DME instruction;Gait training;Stair training;Functional mobility training;Therapeutic activities;Therapeutic exercise;Balance training;Patient/family education    PT Goals (Current goals can be found in the Care Plan section)  Acute Rehab PT Goals Patient Stated Goal: Return home tomorrow PT Goal Formulation: With patient Time For Goal Achievement: 11/21/18 Potential to Achieve Goals: Good    Frequency 7X/week   Barriers to discharge        Co-evaluation               AM-PAC PT "6 Clicks" Mobility  Outcome Measure Help needed turning from your back to your side while in a flat bed without using bedrails?: A Little Help needed moving from lying on your back to sitting on the side of a flat bed without using bedrails?: A Little Help needed moving to and from a bed to a chair (including a wheelchair)?: A Little Help needed standing up from a chair using your arms (e.g., wheelchair or bedside chair)?: A Little Help needed to walk in hospital room?: A Little Help needed climbing 3-5 steps with a railing? : A Little 6 Click Score: 18    End of Session Equipment Utilized During Treatment: Gait belt Activity Tolerance: Patient tolerated treatment  well;Patient limited by pain Patient left: in chair;with call bell/phone within reach Nurse Communication: Mobility status PT Visit Diagnosis: Other abnormalities of gait and mobility (R26.89);Pain Pain - Right/Left: Left Pain - part of body: Leg;Hip    Time: 0802-0829 PT Time Calculation (min) (ACUTE ONLY): 27 min   Charges:   PT Evaluation $PT Eval Low Complexity: 1 Low PT Treatments $Gait Training: 8-22 mins   Mabeline Caras, PT, DPT Acute Rehabilitation Services  Pager [phone] Office [place] 11/07/2018, 9:31 AM

## 2018-11-07 NOTE — Progress Notes (Signed)
Physical Therapy Treatment Patient Details Name: Christopher Hill MRN: [id number] DOB: 05/16/1961 Today's Date: 11/07/2018    History of Present Illness Pt is a 58 y.o. male s/p elective L THA (direct anterior approach) on 11/06/18. PMH includes chronic lympocytic leukemia.   PT Comments    Pt progressing with mobility. Able to increase ambulation distance using RW and intermittent min guard for balance; remains limited by pain and limited L hip flexion. Initiated some LLE therex. Able to perform standing ADLs at supervision-level. Plan for gait and stair training tomorrow. Encouraged additional ambulation with nursing staff this evening.     Follow Up Recommendations  Follow surgeon's recommendation for DC plan and follow-up therapies;Supervision for mobility/OOB(pt planning for OP PT)     Equipment Recommendations  None recommended by PT    Recommendations for Other Services       Precautions / Restrictions Precautions Precautions: Fall Restrictions Weight Bearing Restrictions: Yes LLE Weight Bearing: Weight bearing as tolerated    Mobility  Bed Mobility Overal bed mobility: Needs Assistance Bed Mobility: Supine to Sit     Supine to sit: Min assist     General bed mobility comments: Attempted to use RLE to assist LLE to supine, unable to perform. MinA to assist LLE to supine  Transfers Overall transfer level: Needs assistance Equipment used: Rolling walker (2 wheeled) Transfers: Sit to/from Stand Sit to Stand: Supervision         General transfer comment: Good technique standing; poor eccentric control into sitting due to pain, cues for technique/safety  Ambulation/Gait Ambulation/Gait assistance: Min guard;Supervision Gait Distance (Feet): 90 Feet Assistive device: Rolling walker (2 wheeled) Gait Pattern/deviations: Step-through pattern;Decreased stride length;Decreased weight shift to left;Decreased dorsiflexion - left;Antalgic Gait velocity: Decreased Gait  velocity interpretation: <1.31 ft/sec, indicative of household ambulator General Gait Details: Slow, antalgic gait with RW and intermittent min guard for balance. Pt initially scooting LLE, but quickly able to progress to complete steps. Difficulty progressing LLE forward, using momentum from L knee flexion to increase step length; able to progress away from this too with cues. Intermittent standing rest breaks due to pain/burning   Stairs             Wheelchair Mobility    Modified Rankin (Stroke Patients Only)       Balance Overall balance assessment: Needs assistance   Sitting balance-Leahy Scale: Good Sitting balance - Comments: Good balance sitting EOB, but unable to reach L foot due to pain     Standing balance-Leahy Scale: Fair Standing balance comment: Can static stand without UE support                            Cognition Arousal/Alertness: Awake/alert Behavior During Therapy: WFL for tasks assessed/performed Overall Cognitive Status: Within Functional Limits for tasks assessed                                        Exercises Total Joint Exercises Heel Slides: AAROM;Left;Supine(reliant on BUE support to offload L hip) Hip ABduction/ADduction: AAROM;Left;Supine Long Arc Quad: AAROM;Left;Seated(able to perform full range when pt uses BUEs to flex hip) General Exercises - Lower Extremity Long Arc Quad: AROM;Left;Seated    General Comments        Pertinent Vitals/Pain Pain Assessment: Faces Faces Pain Scale: Hurts little more Pain Location: LLE Pain Descriptors / Indicators: Guarding;Burning Pain Intervention(s):  Monitored during session;Premedicated before session    [place] expects to be discharged to:: Private residence Living Arrangements: Spouse/significant other Available Help at Discharge: Family;Available 24 hours/day Type of Home: House Home Access: Stairs to enter Entrance Stairs-Rails:  Right;Left Home Layout: One level Home Equipment: Environmental consultant - 2 wheels;Cane - single point;Toilet riser;Grab bars - tub/shower;Shower seat      Prior Function Level of Independence: Independent      Comments: Has been using RW at home for past 2 weeks due to pain. 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    PT Goals (current goals can now be found in the care plan section) Acute Rehab PT Goals Patient Stated Goal: Return home tomorrow PT Goal Formulation: With patient Time For Goal Achievement: 11/21/18 Potential to Achieve Goals: Good Progress towards PT goals: Progressing toward goals    Frequency    7X/week      PT Plan Current plan remains appropriate    Co-evaluation              AM-PAC PT "6 Clicks" Mobility   Outcome Measure  Help needed turning from your back to your side while in a flat bed without using bedrails?: A Little Help needed moving from lying on your back to sitting on the side of a flat bed without using bedrails?: A Little Help needed moving to and from a bed to a chair (including a wheelchair)?: A Little Help needed standing up from a chair using your arms (e.g., wheelchair or bedside chair)?: A Little Help needed to walk in hospital room?: A Little Help needed climbing 3-5 steps with a railing? : A Little 6 Click Score: 18    End of Session Equipment Utilized During Treatment: Gait belt Activity Tolerance: Patient tolerated treatment well;Patient limited by pain Patient left: in bed;with call bell/phone within reach Nurse Communication: Mobility status PT Visit Diagnosis: Other abnormalities of gait and mobility (R26.89);Pain Pain - Right/Left: Left Pain - part of body: Leg;Hip     Time: 2703-5009 PT Time Calculation (min) (ACUTE ONLY): 25 min  Charges:  $Gait Training: 8-22 mins $Therapeutic Activity: 8-22 mins                    Mabeline Caras, PT, DPT Acute Rehabilitation Services  Pager  [phone] Office [place] 11/07/2018, 12:39 PM

## 2018-11-07 NOTE — Plan of Care (Signed)
  Problem: Pain Managment: Goal: General experience of comfort will improve Outcome: Progressing   Problem: Safety: Goal: Ability to remain free from injury will improve Outcome: Progressing   Problem: Skin Integrity: Goal: Risk for impaired skin integrity will decrease Outcome: Progressing   

## 2018-11-07 NOTE — TOC Initial Note (Addendum)
Transition of Care [facility]) - Initial/Assessment Note    Patient Details  Name: Christopher Hill MRN: [id number] Date of Birth: 04-24-1961  Transition of [facility]) CM/SW Contact:    Carles Collet, RN Phone Number: 11/07/2018, 11:34 AM  Clinical Narrative:                 Damaris Schooner w patient at bedside. He states he lives at home with his wife and adult daughter. They live in a 900 sq ft apartment. He feels that OP therapies at DC would be more convenient as he does not have a lot of room at home and they will be likely packing/ moving soon. He has a cane, RW and toilet seat riser at home already.   NEEDS referral to OP therapy, preferably [place]   Addendum: Verified w Dr Ninfa Linden that he would consent for [place] OP PT and referral placed.    Expected Discharge Plan: Home/Self Care Barriers to Discharge: Continued Medical Work up   Patient Goals and CMS Choice     Choice offered to / list presented to : NA  Expected Discharge Plan and Services Expected Discharge Plan: Home/Self Care   Discharge Planning Services: CM Consult   Living arrangements for the past 2 months: Apartment                   DME Agency: NA         HH Agency: NA        Prior Living Arrangements/Services Living arrangements for the past 2 months: Apartment Lives with:: Adult Children, Spouse Patient language and need for interpreter reviewed:: Yes Do you feel safe going back to the place where you live?: Yes          Current home services: DME Criminal Activity/Legal Involvement Pertinent to Current Situation/Hospitalization: No - Comment as needed  Activities of Daily Living Home Assistive Devices/Equipment: None ADL Screening (condition at time of admission) Patient's cognitive ability adequate to safely complete daily activities?: Yes Is the patient deaf or have difficulty hearing?: No Does the patient have difficulty seeing, even when wearing glasses/contacts?: No Does the  patient have difficulty concentrating, remembering, or making decisions?: No Patient able to express need for assistance with ADLs?: Yes Does the patient have difficulty dressing or bathing?: No Independently performs ADLs?: Yes (appropriate for developmental age) Does the patient have difficulty walking or climbing stairs?: Yes Weakness of Legs: Left Weakness of Arms/Hands: None  Permission Sought/Granted                  Emotional Assessment              Admission diagnosis:  Osteoarthritis left hip Patient Active Problem List   Diagnosis Date Noted  . Status post total replacement of left hip 11/06/2018  . Unilateral primary osteoarthritis, left hip 08/29/2018  . Chronic lymphoblastic leukemia 01/21/2011  . Allergic rhinitis due to pollen 08/16/2010  . CONJUNCTIVITIS, ALLERGIC 01/23/2008   PCP:  Deland Pretty, MD Pharmacy:   [id number] - [place], [place] [address] AT [address] [place] [address] Phone: [phone] Fax: [phone]     Social Determinants of Health (SDOH) Interventions    Readmission Risk Interventions No flowsheet data found.

## 2018-11-07 NOTE — Progress Notes (Signed)
Subjective: 1 Day Post-Op Procedure(s) (LRB): LEFT TOTAL HIP ARTHROPLASTY ANTERIOR APPROACH (Left) Patient reports pain as moderate.  Some bunring pain about incision.    Objective: Vital signs in last 24 hours: Temp:  [97.2 F (36.2 C)-101 F (38.3 C)] 100.4 F (38 C) (06/03 0635) Pulse Rate:  [54-113] 113 (06/03 0513) Resp:  [10-18] 16 (06/03 0513) BP: (110-166)/(67-84) 126/84 (06/03 0513) SpO2:  [94 %-100 %] 94 % (06/03 0513) Weight:  [95.9 kg-97.1 kg] 97.1 kg (06/02 2216)  Intake/Output from previous day: 06/02 0701 - 06/03 0700 In: 2100 [P.O.:400; I.V.:1700] Out: 1800 [Urine:1600; Blood:200] Intake/Output this shift: Total I/O In: -  Out: 600 [Urine:600]  Recent Labs    11/07/18 0348  HGB 14.3   Recent Labs    11/07/18 0348  WBC 28.5*  RBC 4.70  HCT 43.7  PLT 230   Recent Labs    11/07/18 0348  NA 135  K 4.0  CL 99  CO2 25  BUN 10  CREATININE 0.82  GLUCOSE 115*  CALCIUM 8.8*   No results for input(s): LABPT, INR in the last 72 hours.  Left lower Extremity: Sensation intact distally Intact pulses distally Dorsiflexion/Plantar flexion intact Incision: scant drainage Compartment soft   Assessment/Plan: 1 Day Post-Op Procedure(s) (LRB): LEFT TOTAL HIP ARTHROPLASTY ANTERIOR APPROACH (Left) Up with therapy  Encouraged incentive spirometry.  Leukocytosis at base line due to CLL exacerbated by surgery and  Demargination. On neurontin for paraesthesia left leg.         GILBERT CLARK 11/07/2018, 7:39 AM

## 2018-11-07 NOTE — Anesthesia Postprocedure Evaluation (Signed)
Anesthesia Post Note  Patient: Christopher Hill  Procedure(s) Performed: LEFT TOTAL HIP ARTHROPLASTY ANTERIOR APPROACH (Left Hip)     Patient location during evaluation: PACU Anesthesia Type: Spinal Level of consciousness: oriented and awake and alert Pain management: pain level controlled Vital Signs Assessment: post-procedure vital signs reviewed and stable Respiratory status: spontaneous breathing, respiratory function stable and patient connected to nasal cannula oxygen Cardiovascular status: blood pressure returned to baseline and stable Postop Assessment: no headache, no backache, no apparent nausea or vomiting and spinal receding Anesthetic complications: no    Last Vitals:  Vitals:   11/07/18 0836 11/07/18 1035  BP:  115/66  Pulse:  92  Resp:  18  Temp: 36.9 C 36.8 C  SpO2:  99%    Last Pain:  Vitals:   11/07/18 1035  TempSrc: Oral  PainSc:                  Effie Berkshire

## 2018-11-08 ENCOUNTER — Encounter (HOSPITAL_COMMUNITY): Payer: Self-pay | Admitting: Orthopaedic Surgery

## 2018-11-08 LAB — CBC
HCT: 38.1 % — ABNORMAL LOW (ref 39.0–52.0)
Hemoglobin: 12.2 g/dL — ABNORMAL LOW (ref 13.0–17.0)
MCH: 30 pg (ref 26.0–34.0)
MCHC: 32 g/dL (ref 30.0–36.0)
MCV: 93.8 fL (ref 80.0–100.0)
Platelets: 220 10*3/uL (ref 150–400)
RBC: 4.06 MIL/uL — ABNORMAL LOW (ref 4.22–5.81)
RDW: 13 % (ref 11.5–15.5)
WBC: 28 10*3/uL — ABNORMAL HIGH (ref 4.0–10.5)
nRBC: 0 % (ref 0.0–0.2)

## 2018-11-08 MED ORDER — METHOCARBAMOL 500 MG PO TABS: 500.0000 mg | ORAL_TABLET | Freq: Four times a day (QID) | ORAL | 0 refills | Status: DC | PRN

## 2018-11-08 MED ORDER — OXYCODONE HCL 5 MG PO TABS: 5.0000 mg | ORAL_TABLET | Freq: Four times a day (QID) | ORAL | 0 refills | Status: DC | PRN

## 2018-11-08 MED ORDER — ASPIRIN 81 MG PO CHEW: 81.0000 mg | CHEWABLE_TABLET | Freq: Two times a day (BID) | ORAL | 0 refills | Status: AC

## 2018-11-08 NOTE — Progress Notes (Signed)
Physical Therapy Treatment Patient Details Name: Christopher Hill MRN: [id number] DOB: 1961/04/09 Today's Date: 11/08/2018    History of Present Illness Pt is a 58 y.o. male s/p elective L THA (direct anterior approach) on 11/06/18. PMH includes chronic lympocytic leukemia.    PT Comments    Pt performed limited gait training during session as he had just ambulated with mobility tech.  Focused on progression of stair training to prepare for return home.  Pt is slow and guarded during session and is in more pain.  GUided through supine LE exercises.  Will return this pm to progress gait and review standing exercises.     Follow Up Recommendations  Follow surgeon's recommendation for DC plan and follow-up therapies;Supervision for mobility/OOB(Pt planning for OP PT)     Equipment Recommendations  None recommended by PT    Recommendations for Other Services       Precautions / Restrictions Precautions Precautions: Fall Restrictions Weight Bearing Restrictions: Yes LLE Weight Bearing: Weight bearing as tolerated    Mobility  Bed Mobility               General bed mobility comments: Pr seated in recliner on arrival.    Transfers Overall transfer level: Needs assistance Equipment used: Rolling walker (2 wheeled) Transfers: Sit to/from Stand Sit to Stand: Supervision         General transfer comment: Cues for hand placment and forward weight shifting.  Pt presents with poor eccentric loading.    Ambulation/Gait Ambulation/Gait assistance: Min guard Gait Distance (Feet): 10 Feet(x2 ( to and from stairs for stair training)) Assistive device: Rolling walker (2 wheeled) Gait Pattern/deviations: Step-through pattern;Decreased stride length;Decreased weight shift to left;Decreased dorsiflexion - left;Antalgic Gait velocity: Decreased   General Gait Details: Pt required reminder to lead with LLE into RW.  pt fatigued from ambulating with mobility tech x 200 ft so gt training  significantly limited.     Stairs Stairs: Yes Stairs assistance: Min assist Stair Management: One rail Right;Forwards Number of Stairs: 3 General stair comments: Cues for sequencing and hand placement on railings.     Wheelchair Mobility    Modified Rankin (Stroke Patients Only)       Balance Overall balance assessment: Needs assistance   Sitting balance-Leahy Scale: Good Sitting balance - Comments: Good balance sitting EOB, but unable to reach L foot due to pain     Standing balance-Leahy Scale: Fair Standing balance comment: Can static stand without UE support                            Cognition Arousal/Alertness: Awake/alert Behavior During Therapy: WFL for tasks assessed/performed Overall Cognitive Status: Within Functional Limits for tasks assessed                                        Exercises Total Joint Exercises Ankle Circles/Pumps: AROM;Both;10 reps;Supine Quad Sets: AROM;Left;10 reps;Supine Short Arc Quad: AROM;10 reps;Left;Supine Heel Slides: AAROM;10 reps;Left;Supine Hip ABduction/ADduction: AAROM;Left;Supine;10 reps    General Comments        Pertinent Vitals/Pain Pain Assessment: Faces Faces Pain Scale: Hurts little more Pain Location: LLE Pain Descriptors / Indicators: Guarding;Burning Pain Intervention(s): Monitored during session;Repositioned    Home Living                      Prior Function  PT Goals (current goals can now be found in the care plan section) Acute Rehab PT Goals Patient Stated Goal: Return home tomorrow Potential to Achieve Goals: Good Progress towards PT goals: Progressing toward goals    Frequency    7X/week      PT Plan Current plan remains appropriate    Co-evaluation              AM-PAC PT "6 Clicks" Mobility   Outcome Measure  Help needed turning from your back to your side while in a flat bed without using bedrails?: A Little Help needed  moving from lying on your back to sitting on the side of a flat bed without using bedrails?: A Little Help needed moving to and from a bed to a chair (including a wheelchair)?: A Little Help needed standing up from a chair using your arms (e.g., wheelchair or bedside chair)?: A Little Help needed to walk in hospital room?: A Little Help needed climbing 3-5 steps with a railing? : A Little 6 Click Score: 18    End of Session Equipment Utilized During Treatment: Gait belt Activity Tolerance: Patient tolerated treatment well;Patient limited by pain Patient left: with call bell/phone within reach;in chair;with chair alarm set Nurse Communication: Mobility status PT Visit Diagnosis: Other abnormalities of gait and mobility (R26.89);Pain Pain - Right/Left: Left Pain - part of body: Leg;Hip     Time: 1017-5102 PT Time Calculation (min) (ACUTE ONLY): 23 min  Charges:  $Gait Training: 8-22 mins $Therapeutic Exercise: 8-22 mins                     Governor Rooks, PTA Acute Rehabilitation Services Pager [place] [phone]     Shaili Donalson Eli Hose 11/08/2018, 11:40 AM

## 2018-11-08 NOTE — Progress Notes (Signed)
Physical Therapy Treatment Patient Details Name: Christopher Hill MRN: [id number] DOB: 09-15-1960 Today's Date: 11/08/2018    History of Present Illness Pt is a 58 y.o. male s/p elective L THA (direct anterior approach) on 11/06/18. PMH includes chronic lympocytic leukemia.    PT Comments    Pt performed gait training progression and performs better L foot clearance with step through sequencing.  Pt continues to have increased difficulty initiating step forward.  Performed and reviewed standing and seated exercises.  Pt awaiting d/c home.      Follow Up Recommendations  Follow surgeon's recommendation for DC plan and follow-up therapies;Supervision for mobility/OOB     Equipment Recommendations  None recommended by PT    Recommendations for Other Services       Precautions / Restrictions Precautions Precautions: Fall Restrictions Weight Bearing Restrictions: Yes LLE Weight Bearing: Weight bearing as tolerated    Mobility  Bed Mobility               General bed mobility comments: Pr seated in recliner on arrival.    Transfers Overall transfer level: Needs assistance Equipment used: Rolling walker (2 wheeled) Transfers: Sit to/from Stand Sit to Stand: Supervision         General transfer comment: Cues for hand placment and forward weight shifting.  Pt presents with poor eccentric loading.    Ambulation/Gait Ambulation/Gait assistance: Min guard Gait Distance (Feet): 150 Feet Assistive device: Rolling walker (2 wheeled) Gait Pattern/deviations: Step-through pattern;Decreased stride length;Decreased weight shift to left;Decreased dorsiflexion - left;Antalgic Gait velocity: Decreased   General Gait Details: pt has difficulty advancing LLE forward with step to sequencing, progressed to step through pattern and he is able to lift LLE and swing forward.  Pt limited gt distance due to fatigue.     Stairs Stairs: Yes Stairs assistance: Min assist Stair Management:  One rail Right;Forwards Number of Stairs: 3 General stair comments: Cues for sequencing and hand placement on railings.     Wheelchair Mobility    Modified Rankin (Stroke Patients Only)       Balance Overall balance assessment: Needs assistance   Sitting balance-Leahy Scale: Good Sitting balance - Comments: Good balance sitting EOB, but unable to reach L foot due to pain     Standing balance-Leahy Scale: Fair Standing balance comment: Can static stand without UE support                            Cognition Arousal/Alertness: Awake/alert Behavior During Therapy: WFL for tasks assessed/performed Overall Cognitive Status: Within Functional Limits for tasks assessed                                        Exercises Total Joint Exercises Ankle Circles/Pumps: AROM;Both;10 reps;Supine Quad Sets: AROM;Left;10 reps;Supine Short Arc Quad: AROM;10 reps;Left;Supine Heel Slides: AAROM;10 reps;Left;Supine Hip ABduction/ADduction: AROM;Left;10 reps;Standing Long Arc Quad: AROM;Left;10 reps;Seated Knee Flexion: AROM;Left;10 reps;Standing Marching in Standing: Left;10 reps;Standing;AAROM(unable to lift into marching without assistance. ) Standing Hip Extension: AROM;Left;10 reps;Standing    General Comments        Pertinent Vitals/Pain Pain Assessment: 0-10 Pain Score: 3  Faces Pain Scale: Hurts little more Pain Location: LLE Pain Descriptors / Indicators: Guarding;Burning;Numbness Pain Intervention(s): Monitored during session;Repositioned;Patient requesting pain meds-RN notified(requesting muscle relaxer)    Home Living  Prior Function            PT Goals (current goals can now be found in the care plan section) Acute Rehab PT Goals Patient Stated Goal: Return home tomorrow Potential to Achieve Goals: Good Progress towards PT goals: Progressing toward goals    Frequency    7X/week      PT Plan Current  plan remains appropriate    Co-evaluation              AM-PAC PT "6 Clicks" Mobility   Outcome Measure  Help needed turning from your back to your side while in a flat bed without using bedrails?: A Little Help needed moving from lying on your back to sitting on the side of a flat bed without using bedrails?: A Little Help needed moving to and from a bed to a chair (including a wheelchair)?: A Little Help needed standing up from a chair using your arms (e.g., wheelchair or bedside chair)?: A Little Help needed to walk in hospital room?: A Little Help needed climbing 3-5 steps with a railing? : A Little 6 Click Score: 18    End of Session Equipment Utilized During Treatment: Gait belt Activity Tolerance: Patient tolerated treatment well;Patient limited by pain Patient left: with call bell/phone within reach;in chair;with chair alarm set Nurse Communication: Mobility status PT Visit Diagnosis: Other abnormalities of gait and mobility (R26.89);Pain Pain - Right/Left: Left Pain - part of body: Leg;Hip     Time: 9371-6967 PT Time Calculation (min) (ACUTE ONLY): 26 min  Charges:  $Gait Training: 8-22 mins $Therapeutic Exercise: 8-22 mins                     Governor Rooks, PTA Acute Rehabilitation Services Pager [phone] Office [place] 11/08/2018, 1:58 PM

## 2018-11-08 NOTE — Discharge Instructions (Signed)

## 2018-11-08 NOTE — Progress Notes (Signed)
Patient ID: Christopher Hill, male   DOB: February 01, 1961, 58 y.o.   MRN: [id number] Doing well overall.  Vitals stable.  Labs looks good.  Left hip dressing intact and left hip stable.  Can be discharged to home today.

## 2018-11-08 NOTE — Discharge Summary (Signed)
Patient ID: Christopher Hill MRN: [id number] DOB/AGE: 60-Aug-1962 60 y.o.  Admit date: 11/06/2018 Discharge date: 11/08/2018  Admission Diagnoses:  Principal Problem:   Unilateral primary osteoarthritis, left hip Active Problems:   Status post total replacement of left hip   Discharge Diagnoses:  Same  Past Medical History:  Diagnosis Date  . Allergic rhinitis, cause unspecified   . Chronic lymphoblastic leukemia   . CLL (chronic lymphocytic leukemia) ([place]) 2014  . History of kidney stones   . Other chronic allergic conjunctivitis     Surgeries: Procedure(s): LEFT TOTAL HIP ARTHROPLASTY ANTERIOR APPROACH on 11/06/2018   Consultants:   Discharged Condition: Improved  Hospital Course: Christopher Hill is a 58 y.o. male who was admitted 11/06/2018 for operative treatment ofUnilateral primary osteoarthritis, left hip. Patient has severe unremitting pain that affects sleep, daily activities, and work/hobbies. After pre-op clearance the patient was taken to the operating room on 11/06/2018 and underwent  Procedure(s): LEFT TOTAL HIP ARTHROPLASTY ANTERIOR APPROACH.    Patient was given perioperative antibiotics:  Anti-infectives (From admission, onward)   Start     Dose/Rate Route Frequency Ordered Stop   11/06/18 2000  ceFAZolin (ANCEF) IVPB 1 g/50 mL premix  Status:  Discontinued     1 g 100 mL/hr over 30 Minutes Intravenous Every 6 hours 11/06/18 1922 11/06/18 2117   11/06/18 1100  ceFAZolin (ANCEF) IVPB 2g/100 mL premix     2 g 200 mL/hr over 30 Minutes Intravenous On call to O.R. 11/06/18 1046 11/06/18 1225       Patient was given sequential compression devices, early ambulation, and chemoprophylaxis to prevent DVT.  Patient benefited maximally from hospital stay and there were no complications.    Recent vital signs:  Patient Vitals for the past 24 hrs:  BP Temp Temp src Pulse Resp SpO2  11/08/18 0343 130/70 99.4 F (37.4 C) Oral 91 18 94 %  11/07/18 1937 118/73 99.7 F  (37.6 C) Oral (!) 108 17 96 %  11/07/18 1437 137/70 99 F (37.2 C) Oral (!) 106 15 98 %  11/07/18 1035 115/66 98.3 F (36.8 C) Oral 92 18 99 %  11/07/18 0836 - 98.5 F (36.9 C) Oral - - -     Recent laboratory studies:  Recent Labs    11/07/18 0348 11/08/18 0355  WBC 28.5* 28.0*  HGB 14.3 12.2*  HCT 43.7 38.1*  PLT 230 220  NA 135  --   K 4.0  --   CL 99  --   CO2 25  --   BUN 10  --   CREATININE 0.82  --   GLUCOSE 115*  --   CALCIUM 8.8*  --      Discharge Medications:   Allergies as of 11/08/2018      Reactions   Azithromycin Nausea And Vomiting   REACTION: GI upset      Medication List    STOP taking these medications   diclofenac 75 MG EC tablet Commonly known as:  VOLTAREN     TAKE these medications   aspirin 81 MG chewable tablet Chew 1 tablet (81 mg total) by mouth 2 (two) times daily.   atorvastatin 20 MG tablet Commonly known as:  LIPITOR Take 20 mg by mouth at bedtime.   clobetasol 0.05 % external solution Commonly known as:  TEMOVATE Apply 1 application topically daily. Apply to scalp   fexofenadine 180 MG tablet Commonly known as:  ALLEGRA Take 180 mg by mouth daily.   fluticasone  50 MCG/ACT nasal spray Commonly known as:  FLONASE Place 2 sprays into both nostrils daily.   Lotemax SM 0.38 % Gel Generic drug:  Loteprednol Etabonate Place 1 drop into both eyes daily.   methocarbamol 500 MG tablet Commonly known as:  ROBAXIN Take 1 tablet (500 mg total) by mouth every 6 (six) hours as needed for muscle spasms.   multivitamin tablet Take 1 tablet by mouth daily.   Olopatadine HCl 0.2 % Soln Place 1 drop into both eyes daily as needed (eye allergies).   oxyCODONE 5 MG immediate release tablet Commonly known as:  Oxy IR/ROXICODONE Take 1-2 tablets (5-10 mg total) by mouth every 6 (six) hours as needed for moderate pain (pain score 4-6).            Durable Medical Equipment  (From admission, onward)         Start      Ordered   11/06/18 2223  DME 3 n 1  Once     11/06/18 2222   11/06/18 2223  DME Walker rolling  Once    Question:  Patient needs a walker to treat with the following condition  Answer:  Status post total replacement of left hip   11/06/18 2222          Diagnostic Studies: Dg Pelvis Portable  Result Date: 11/06/2018 CLINICAL DATA:  Status post left hip replacement EXAM: PORTABLE PELVIS 1-2 VIEWS COMPARISON:  None. FINDINGS: Pelvic ring is intact. Left hip replacement is seen. No acute fracture or dislocation is noted. No soft tissue abnormality is seen. IMPRESSION: Status post left hip replacement.  No acute abnormality seen. Electronically Signed   By: Inez Catalina M.D.   On: 11/06/2018 15:15   Dg C-arm 1-60 Min  Result Date: 11/06/2018 CLINICAL DATA:  Anterior approach left total hip replacement. EXAM: DG C-ARM 61-120 MIN; OPERATIVE LEFT HIP WITH PELVIS FLUOROSCOPY TIME:  28 seconds COMPARISON:  None. FINDINGS: Four spot intraoperative fluoroscopic images of the left hip and lower pelvis are provided for review. Images demonstrate the sequela of left total hip replacement. Alignment appears anatomic given AP projection. A minimal amount of expected subcutaneous emphysema is noted about the operative site. No radiopaque foreign body. IMPRESSION: Post left total hip replacement without evidence of complication. Electronically Signed   By: Sandi Mariscal M.D.   On: 11/06/2018 14:01   Dg Hip Operative Unilat W Or W/o Pelvis Left  Result Date: 11/06/2018 CLINICAL DATA:  Anterior approach left total hip replacement. EXAM: DG C-ARM 61-120 MIN; OPERATIVE LEFT HIP WITH PELVIS FLUOROSCOPY TIME:  28 seconds COMPARISON:  None. FINDINGS: Four spot intraoperative fluoroscopic images of the left hip and lower pelvis are provided for review. Images demonstrate the sequela of left total hip replacement. Alignment appears anatomic given AP projection. A minimal amount of expected subcutaneous emphysema is noted about  the operative site. No radiopaque foreign body. IMPRESSION: Post left total hip replacement without evidence of complication. Electronically Signed   By: Sandi Mariscal M.D.   On: 11/06/2018 14:01    Disposition: Discharge disposition: 01-Home or Self Care       Discharge Instructions    Ambulatory referral to Physical Therapy   Complete by:  As directed       [facility].   Specialty:  Rehabilitation Why:  They will call you in 3-5 business days to schedule an appointment for therapy Contact information: [place]  [address]       Mcarthur Rossetti, MD. Schedule an appointment as soon as possible for a visit in 2 week(s).   Specialty:  Orthopedic Surgery Contact information: [place] [address] [phone]            Signed: Mcarthur Rossetti 11/08/2018, 7:53 AM

## 2018-11-15 ENCOUNTER — Ambulatory Visit: Payer: BC Managed Care – PPO | Admitting: Physician Assistant

## 2018-11-16 ENCOUNTER — Ambulatory Visit: Payer: BC Managed Care – PPO | Admitting: Physical Therapy

## 2018-11-16 ENCOUNTER — Encounter: Payer: Self-pay | Admitting: Physical Therapy

## 2018-11-16 ENCOUNTER — Ambulatory Visit: Payer: BC Managed Care – PPO | Attending: Orthopaedic Surgery | Admitting: Physical Therapy

## 2018-11-16 ENCOUNTER — Other Ambulatory Visit: Payer: Self-pay

## 2018-11-16 DIAGNOSIS — M25552 Pain in left hip: Secondary | ICD-10-CM

## 2018-11-16 DIAGNOSIS — M25652 Stiffness of left hip, not elsewhere classified: Secondary | ICD-10-CM | POA: Diagnosis not present

## 2018-11-16 DIAGNOSIS — R262 Difficulty in walking, not elsewhere classified: Secondary | ICD-10-CM | POA: Insufficient documentation

## 2018-11-16 NOTE — Therapy (Signed)
[place] [address], [place], [address] Phone: [phone]   Fax:  [phone]  Physical Therapy Evaluation  Patient Details  Name: Christopher Hill MRN: [id number] Date of Birth: [birth year] Referring Provider (PT): Ninfa Linden   Encounter Date: 11/16/2018  PT End of Session - 11/16/18 1155    Visit Number  1    Date for PT Re-Evaluation  01/16/19    PT Start Time  1106    PT Stop Time  1155    PT Time Calculation (min)  49 min    Activity Tolerance  Patient tolerated treatment well;Patient limited by pain    Behavior During Therapy  [facility] for tasks assessed/performed       Past Medical History:  Diagnosis Date  . Allergic rhinitis, cause unspecified   . Chronic lymphoblastic leukemia   . CLL (chronic lymphocytic leukemia) ([place]) 2014  . History of kidney stones   . Other chronic allergic conjunctivitis     Past Surgical History:  Procedure Laterality Date  . CATARACT EXTRACTION    . COLONOSCOPY    . LITHOTRIPSY  1997, 1995  . TONSILLECTOMY  1964  . TOTAL HIP ARTHROPLASTY Left 11/06/2018   Procedure: LEFT TOTAL HIP ARTHROPLASTY ANTERIOR APPROACH;  Surgeon: Mcarthur Rossetti, MD;  Location: [facility];  Service: Orthopedics;  Laterality: Left;    There were no vitals filed for this visit.   Subjective Assessment - 11/16/18 1111    Subjective  Patient underwent an anterior left THA on 11/06/18.  Patient reports a few years of left hip pain and difficulty walking.  He reports that he has been doing some exercises that were given at the hospital, he reports a lot of swelling and difficulty moving the left leg.    Limitations  Sitting;Lifting;Standing;Walking;House hold activities    How long can you walk comfortably?  200 feet    Patient Stated Goals  have less pain, less swelling, walk much better, return to gym setting    Currently in Pain?  Yes    Pain Score  3     Pain Location  Hip    Pain  Orientation  Left    Pain Descriptors / Indicators  Aching;Burning;Tightness    Pain Type  Acute pain;Surgical pain    Pain Radiating Towards  denies    Pain Onset  1 to 4 weeks ago    Pain Frequency  Constant    Aggravating Factors   walking, in and out of car, putting on shoes and socks, pain up to 8/10    Pain Relieving Factors  rest, pain meds  pain can be down to a 2-3/10    Effect of Pain on Daily Activities  difficulty walking, putting on shoes and socks, in and out of car and bed         [facility] PT Assessment - 11/16/18 0001      Assessment   Medical Diagnosis  s/p left anterior approach THA    Referring Provider (PT)  Ninfa Linden    Onset Date/Surgical Date  11/06/18    Prior Therapy  no      Precautions   Precautions  None      Balance Screen   Has the patient fallen in the past 6 months  No    Has the patient had a decrease in activity level because of a fear of falling?   No    Is the patient reluctant to leave  their home because of a fear of falling?   No      Home Environment   Additional Comments  single story 3 steps into the home, does some housework      Prior Function   Level of Independence  Independent    Vocation  Full time employment    Vocation Requirements  some travelling, sitting at computer    Leisure  elliptical, gym 3x/week, some weights      ROM / Strength   AROM / PROM / Strength  AROM;Strength      AROM   AROM Assessment Site  Hip;Knee    Right/Left Hip  Left    Left Hip Extension  10    Left Hip Flexion  50    Left Hip External Rotation   15    Left Hip Internal Rotation   0    Left Hip ABduction  10    Right/Left Knee  Left    Left Knee Extension  12    Left Knee Flexion  110      Strength   Overall Strength Comments  4-/5 with pain in the anterior hip      Palpation   Palpation comment  left hip and thigh are swollen, the tissue feels dense, he is tender in the left anterior hip around the surgical site, staples are present, they  will come out next week      Ambulation/Gait   Gait Comments  use of the FWW, slow, step to pattern, worked with him using a SPC, a lot of cues for posture, to bend the knee and to take bigger steps      Standardized Balance Assessment   Standardized Balance Assessment  Timed Up and Go Test      Timed Up and Go Test   Normal TUG (seconds)  24    TUG Comments  FWW                Objective measurements completed on examination: See above findings.      Person Adult PT Treatment/Exercise - 11/16/18 0001      Exercises   Exercises  Knee/Hip      Knee/Hip Exercises: Aerobic   Nustep  level 3 x 6 minutes             PT Education - 11/16/18 1153    Education Details  advised him to continue to doe th eexercises form the hospital, but biggest thing is to work on his gait pattern and try using the [facility] more    Person(s) Educated  Patient    Methods  Explanation;Demonstration;Verbal cues    Comprehension  Verbalized understanding;Returned demonstration;Verbal cues required;Tactile cues required          PT Long Term Goals - 11/16/18 1159      PT LONG TERM GOAL #1   Title  decrease TUG time to 13 seconds    Time  8    Period  Weeks    Status  New      PT LONG TERM GOAL #2   Title  walk without device with minimal deviation    Time  8    Period  Weeks    Status  New      PT LONG TERM GOAL #3   Title  independent with an advanced HEP or a return to the gym    Time  8    Period  Weeks    Status  New  PT LONG TERM GOAL #4   Title  increase left hip flexion to 95 degrees    Time  8    Period  Weeks    Status  New      PT LONG TERM GOAL #5   Title  report no difficulty putting on socks and shoes    Time  8    Period  Weeks    Status  New             Plan - 11/16/18 1156    Clinical Impression Statement  Patient reports a few years of left hip pain, had bone on bone issues, he underwent a left anterior approach THA on 11/06/18.  He has  staple present now in the incision site.  He has limited ROM and strength due to pain and stiffness, the left LE is swollen.  He is using a FWW, slow, at times step to pattern and a stiff left leg with elevation and circumduction.    Examination-Activity Limitations  Bathing;Squat;Lift;Stairs;Bend;Locomotion Level;Stand;Toileting;Dressing    Stability/Clinical Decision Making  Evolving/Moderate complexity    Clinical Decision Making  Moderate    Rehab Potential  Good    PT Frequency  3x / week    PT Duration  8 weeks    PT Treatment/Interventions  ADLs/Self Care Home Management;Cryotherapy;Electrical Stimulation;Gait training;Stair training;Functional mobility training;Therapeutic activities;Therapeutic exercise;Balance training;Neuromuscular re-education;Manual techniques;Patient/family education    PT Next Visit Plan  slowly start flexibility, strnegth and gait, he gets the staples removed next week, our [place] office is closer to him so he will transfer there    Consulted and Agree with Plan of Care  Patient       Patient will benefit from skilled therapeutic intervention in order to improve the following deficits and impairments:  Abnormal gait, Pain, Decreased scar mobility, Decreased mobility, Decreased activity tolerance, Decreased endurance, Decreased range of motion, Decreased strength, Impaired flexibility, Increased edema, Difficulty walking, Decreased balance  Visit Diagnosis: Pain in left hip - Plan: PT plan of care cert/re-cert  Stiffness of left hip, not elsewhere classified - Plan: PT plan of care cert/re-cert  Difficulty in walking, not elsewhere classified - Plan: PT plan of care cert/re-cert     Problem List Patient Active Problem List   Diagnosis Date Noted  . Status post total replacement of left hip 11/06/2018  . Unilateral primary osteoarthritis, left hip 08/29/2018  . Chronic lymphoblastic leukemia 01/21/2011  . Allergic rhinitis due to pollen 08/16/2010  .  CONJUNCTIVITIS, ALLERGIC 01/23/2008    Sumner Boast., PT 11/16/2018, 12:02 PM  [address] [place], [place], [address] Phone: [phone]   Fax:  [phone]  Name: GURNIE DURIS MRN: [id number] Date of Birth: 1961-04-20

## 2018-11-19 ENCOUNTER — Ambulatory Visit: Payer: BC Managed Care – PPO

## 2018-11-19 ENCOUNTER — Other Ambulatory Visit: Payer: Self-pay

## 2018-11-19 DIAGNOSIS — M25652 Stiffness of left hip, not elsewhere classified: Secondary | ICD-10-CM | POA: Diagnosis not present

## 2018-11-19 DIAGNOSIS — R262 Difficulty in walking, not elsewhere classified: Secondary | ICD-10-CM | POA: Diagnosis not present

## 2018-11-19 DIAGNOSIS — M25552 Pain in left hip: Secondary | ICD-10-CM | POA: Diagnosis not present

## 2018-11-19 NOTE — Therapy (Signed)
[facility] [address]  [place], [place], [address] Phone: [phone]   Fax:  [phone]  Physical Therapy Treatment  Patient Details  Name: Christopher Hill MRN: [id number] Date of Birth: Apr 08, 1961 Referring Provider (PT): Ninfa Linden   Encounter Date: 11/19/2018  PT End of Session - 11/19/18 1510    Visit Number  2    Date for PT Re-Evaluation  01/16/19    PT Start Time  1505    PT Stop Time  1555    PT Time Calculation (min)  50 min    Activity Tolerance  Patient tolerated treatment well;Patient limited by pain    Behavior During Therapy  [facility] for tasks assessed/performed       Past Medical History:  Diagnosis Date  . Allergic rhinitis, cause unspecified   . Chronic lymphoblastic leukemia   . CLL (chronic lymphocytic leukemia) ([facility]) 2014  . History of kidney stones   . Other chronic allergic conjunctivitis     Past Surgical History:  Procedure Laterality Date  . CATARACT EXTRACTION    . COLONOSCOPY    . LITHOTRIPSY  1997, 1995  . TONSILLECTOMY  1964  . TOTAL HIP ARTHROPLASTY Left 11/06/2018   Procedure: LEFT TOTAL HIP ARTHROPLASTY ANTERIOR APPROACH;  Surgeon: Mcarthur Rossetti, MD;  Location: [place];  Service: Orthopedics;  Laterality: Left;    There were no vitals filed for this visit.  Subjective Assessment - 11/19/18 1514    Subjective  Pt. noting he has been performing 3-direction hip kicker exercises daily from hospital.    Patient Stated Goals  have less pain, less swelling, walk much better, return to gym setting    Currently in Pain?  No/denies    Pain Score  0-No pain   Pt. noting 3/10 at worst   Pain Location  Hip    Pain Orientation  Left;Anterior    Pain Descriptors / Indicators  Burning    Pain Type  Acute pain    Pain Frequency  Intermittent    Aggravating Factors   walking    Pain Relieving Factors  rest                       [facility] Adult PT  Treatment/Exercise - 11/19/18 0001      Knee/Hip Exercises: Stretches   Passive Hamstring Stretch  Left;1 rep;30 seconds    Passive Hamstring Stretch Limitations  strap     Hip Flexor Stretch  Left;1 rep;30 seconds    Hip Flexor Stretch Limitations  mod thomas position + strap     Piriformis Stretch  Left;1 rep;30 seconds    Piriformis Stretch Limitations  KTOS       Knee/Hip Exercises: Aerobic   Nustep  level 1 x 6 minutes (UE/LE)      Knee/Hip Exercises: Standing   Heel Raises  Both;3 seconds;15 reps    Heel Raises Limitations  UE support    Hip Extension  Right;Left;Stengthening;10 reps;Knee straight    Extension Limitations  45 dg kickbacks      Knee/Hip Exercises: Seated   Long Arc Quad  Left;10 reps;Strengthening    Long Arc Quad Limitations  cues for TKE    Hamstring Curl  Left;10 reps    Hamstring Limitations  manual resistance       Knee/Hip Exercises: Supine   Straight Leg Raises  2 sets;Right;5 reps    Knee Flexion  Both;Strengthening;10 reps    Knee  Flexion Limitations  with heels on peanut p-ball              PT Education - 11/19/18 1709    Education Details  HEP update; mod thomas hip flexor stretch with strap and pt. instructed to allow for L LE support on floor for gentle stretch    Person(s) Educated  Patient    Methods  Explanation;Demonstration;Verbal cues;Handout    Comprehension  Verbalized understanding;Returned demonstration;Verbal cues required          PT Long Term Goals - 11/16/18 1159      PT LONG TERM GOAL #1   Title  decrease TUG time to 13 seconds    Time  8    Period  Weeks    Status  New      PT LONG TERM GOAL #2   Title  walk without device with minimal deviation    Time  8    Period  Weeks    Status  New      PT LONG TERM GOAL #3   Title  independent with an advanced HEP or a return to the gym    Time  8    Period  Weeks    Status  New      PT LONG TERM GOAL #4   Title  increase left hip flexion to 95 degrees     Time  8    Period  Weeks    Status  New      PT LONG TERM GOAL #5   Title  report no difficulty putting on socks and shoes    Time  8    Period  Weeks    Status  New            Plan - 11/19/18 1525    Clinical Impression Statement  Pt. reporting he is performing 3-way hip kicker from hospital exercises at home daily.  Tolerated all gentle L LE flexibility and proximal hip/quad strengthening activities well today.  Pt. remains very ttp in L anterior hip with mod pain with hip flexion activities.  HEP updated with mod Thomas position hip flexor stretch and issued to pt. via handout with special focus on performing at home with L LE support on floor for gentle stretch.  Will continue to progress toward goals.    Rehab Potential  Good    PT Treatment/Interventions  ADLs/Self Care Home Management;Cryotherapy;Electrical Stimulation;Gait training;Stair training;Functional mobility training;Therapeutic activities;Therapeutic exercise;Balance training;Neuromuscular re-education;Manual techniques;Patient/family education    PT Next Visit Plan  slowly start flexibility, strnegth and gait, he gets the staples removed next week    Consulted and Agree with Plan of Care  Patient       Patient will benefit from skilled therapeutic intervention in order to improve the following deficits and impairments:  Abnormal gait, Pain, Decreased scar mobility, Decreased mobility, Decreased activity tolerance, Decreased endurance, Decreased range of motion, Decreased strength, Impaired flexibility, Increased edema, Difficulty walking, Decreased balance  Visit Diagnosis: 1. Pain in left hip   2. Stiffness of left hip, not elsewhere classified   3. Difficulty in walking, not elsewhere classified        Problem List Patient Active Problem List   Diagnosis Date Noted  . Status post total replacement of left hip 11/06/2018  . Unilateral primary osteoarthritis, left hip 08/29/2018  . Chronic lymphoblastic  leukemia 01/21/2011  . Allergic rhinitis due to pollen 08/16/2010  . CONJUNCTIVITIS, ALLERGIC 01/23/2008    Bess Harvest, PTA 11/19/18  5:19 PM    [facility] [address]  [place], [place], [address] Phone: [phone]   Fax:  [phone]  Name: Christopher Hill MRN: [id number] Date of Birth: 04-Sep-1960

## 2018-11-20 ENCOUNTER — Other Ambulatory Visit: Payer: Self-pay

## 2018-11-20 ENCOUNTER — Ambulatory Visit (INDEPENDENT_AMBULATORY_CARE_PROVIDER_SITE_OTHER): Payer: BC Managed Care – PPO | Admitting: Physician Assistant

## 2018-11-20 ENCOUNTER — Ambulatory Visit: Payer: BC Managed Care – PPO | Admitting: Physical Therapy

## 2018-11-20 ENCOUNTER — Encounter: Payer: Self-pay | Admitting: Physician Assistant

## 2018-11-20 DIAGNOSIS — Z96642 Presence of left artificial hip joint: Secondary | ICD-10-CM

## 2018-11-20 NOTE — Progress Notes (Signed)
HPI: [name] returns today 14 days status post left total hip arthroplasty.  He is overall doing well.  He is walking with a cane.  Feels his range of motion strength improving.  He did have a significant swelling in the leg postop.  He denies any shortness of breath or chest pain.  Is been on aspirin 81 mg twice daily.  He stopped taking oxycodone and Robaxin a week ago.  Physical exam: Left hip surgical incision healing well well approximated with staples no signs of infection.  Left calf supple nontender.  Dorsiflexion plantarflexion ankle intact.  Impression: 2-week status post left total hip arthroplasty  Plan: Staples removed Steri-Strips applied.  He takes aspirin once a day for 1 week and then discontinue as he is on no aspirin prior to surgery.  Follow-up with [place] in 1 month sooner if there is any questions or concerns.  He did discuss some burning about his hip incision and discuss with him taking Neurontin he rather not take any pills therefore he will call us if the burning sensation about hip becomes worse.  He will work on scar tissue mobilization.

## 2018-11-21 ENCOUNTER — Ambulatory Visit: Payer: BC Managed Care – PPO

## 2018-11-21 DIAGNOSIS — R262 Difficulty in walking, not elsewhere classified: Secondary | ICD-10-CM

## 2018-11-21 DIAGNOSIS — M25652 Stiffness of left hip, not elsewhere classified: Secondary | ICD-10-CM | POA: Diagnosis not present

## 2018-11-21 DIAGNOSIS — M25552 Pain in left hip: Secondary | ICD-10-CM

## 2018-11-21 NOTE — Therapy (Signed)
[facility] [address]  [place], [place], [address] Phone: [phone]   Fax:  [phone]  Physical Therapy Treatment  Patient Details  Name: Christopher Hill MRN: [id number] Date of Birth: 1960-07-28 Referring Provider (PT): Ninfa Linden   Encounter Date: 11/21/2018  PT End of Session - 11/21/18 1408    Visit Number  3    Date for PT Re-Evaluation  01/16/19    PT Start Time  2376    PT Stop Time  2831   ended visit with ice pack and heat pack   PT Time Calculation (min)  66 min    Activity Tolerance  Patient tolerated treatment well    Behavior During Therapy  [facility] for tasks assessed/performed       Past Medical History:  Diagnosis Date  . Allergic rhinitis, cause unspecified   . Chronic lymphoblastic leukemia   . CLL (chronic lymphocytic leukemia) ([place]) 2014  . History of kidney stones   . Other chronic allergic conjunctivitis     Past Surgical History:  Procedure Laterality Date  . CATARACT EXTRACTION    . COLONOSCOPY    . LITHOTRIPSY  1997, 1995  . TONSILLECTOMY  1964  . TOTAL HIP ARTHROPLASTY Left 11/06/2018   Procedure: LEFT TOTAL HIP ARTHROPLASTY ANTERIOR APPROACH;  Surgeon: Mcarthur Rossetti, MD;  Location: [facility];  Service: Orthopedics;  Laterality: Left;    There were no vitals filed for this visit.  Subjective Assessment - 11/21/18 1415    Subjective  Pt. noting no issues with updated HEP.    Patient Stated Goals  have less pain, less swelling, walk much better, return to gym setting    Currently in Pain?  Yes    Pain Score  2     Pain Orientation  Left;Lateral;Anterior    Pain Descriptors / Indicators  Burning    Pain Type  Surgical pain                       [facility] Adult PT Treatment/Exercise - 11/21/18 0001      Self-Care   Self-Care  Other Self-Care Comments    Other Self-Care Comments   roller "stick" to L anterior/lateral quad for reduction in tone and tenderness        Knee/Hip Exercises: Stretches   Passive Hamstring Stretch  Left;1 rep;30 seconds    Passive Hamstring Stretch Limitations  strap     Quad Stretch  Left;1 rep;30 seconds    Quad Stretch Limitations  prone quad stretch with strap     Hip Flexor Stretch  Left;1 rep;30 seconds    Hip Flexor Stretch Limitations  mod thomas position + strap     ITB Stretch  Left;1 rep;30 seconds    ITB Stretch Limitations  strap     Piriformis Stretch  Left;1 rep;30 seconds    Piriformis Stretch Limitations  KTOS     Gastroc Stretch  Left;1 rep;30 seconds    Gastroc Stretch Limitations  leaning into counter      Knee/Hip Exercises: Aerobic   Nustep  level 1 x 7 minutes (UE/LE)      Knee/Hip Exercises: Machines for Strengthening   Cybex Knee Flexion  B LE's: 15# 2 x 15 reps; 2nd set with 20#       Knee/Hip Exercises: Standing   Heel Raises  Both;3 seconds;15 reps    Heel Raises Limitations  UE support    Hip Flexion  Right;Left;Stengthening;10 reps;Knee bent    Hip Flexion Limitations  toe clears to 8" step    Terminal Knee Extension  Left;15 reps;Theraband;Strengthening    Theraband Level (Terminal Knee Extension)  Level 4 (Blue)    Terminal Knee Extension Limitations  L TKE      Knee/Hip Exercises: Seated   Long Arc Quad  Left;10 reps;Strengthening    Long Arc Quad Limitations  cues for TKE    Other Seated Knee/Hip Exercises  L fitter leg (1 blue, 1 black) x 15 reps       Knee/Hip Exercises: Supine   Bridges with [employer]  Both;10 reps      Knee/Hip Exercises: Sidelying   Clams  L clam shell x 10 reps       Modalities   Modalities  Cryotherapy;Moist Heat      Moist Heat Therapy   Number Minutes Moist Heat  10 Minutes    Moist Heat Location  --   L distal/mid quad      Cryotherapy   Number Minutes Cryotherapy  10 Minutes    Cryotherapy Location  Hip   L anterior/lateral hip over incision    Type of Cryotherapy  Ice pack      Manual Therapy   Manual Therapy  Soft tissue  mobilization    Manual therapy comments  seated     Soft tissue mobilization  STM to L mid/distal quad              PT Education - 11/21/18 1522    Education Details  HEP update    Person(s) Educated  Patient    Methods  Explanation;Demonstration;Verbal cues;Handout    Comprehension  Verbalized understanding;Returned demonstration;Verbal cues required          PT Long Term Goals - 11/21/18 1440      PT LONG TERM GOAL #1   Title  decrease TUG time to 13 seconds    Time  8    Period  Weeks    Status  On-going      PT LONG TERM GOAL #2   Title  walk without device with minimal deviation    Time  8    Period  Weeks    Status  On-going      PT LONG TERM GOAL #3   Title  independent with an advanced HEP or a return to the gym    Time  8    Period  Weeks    Status  On-going      PT LONG TERM GOAL #4   Title  increase left hip flexion to 95 degrees    Time  8    Period  Weeks    Status  On-going      PT LONG TERM GOAL #5   Title  report no difficulty putting on socks and shoes    Time  8    Period  Weeks    Status  On-going            Plan - 11/21/18 1413    Clinical Impression Statement  Pt. seen to start session ambulating with step-through gait pattern and some improved L LE weight shift with SPC.  Had staples removed yesterday at f/u with MD with application of Steri-Strips.  MD encouraged pt. to perform scar massage thus reviewed gentle incision massage with pt. to check for proper technique.  Pt. able to demo proper technique with scar massage and L quad roller "stick" to distal quad  in area of tenderness today.  Session focused on gentle progression of proximal hip strengthening and LE flexibility exercises which were tolerated well.  Most discomfort with hip flexion-based exercises however relieved with rest.  Ended session with application of moist heat pack to distal L quad for reduction in tone and ice pack application to L anterior hip for reduction  in swelling at site of incision.  Pt. will benefit from further skilled therapy to improved functional ROM, mobility, and improved tolerance for ADL's.    Examination-Activity Limitations  Bathing;Squat;Lift;Stairs;Bend;Locomotion Level;Stand;Toileting;Dressing    Rehab Potential  Good    PT Treatment/Interventions  ADLs/Self Care Home Management;Cryotherapy;Electrical Stimulation;Gait training;Stair training;Functional mobility training;Therapeutic activities;Therapeutic exercise;Balance training;Neuromuscular re-education;Manual techniques;Patient/family education    PT Next Visit Plan  slowly start flexibility, strength and gait; (staples were removed on 11/20/18)    Consulted and Agree with Plan of Care  Patient       Patient will benefit from skilled therapeutic intervention in order to improve the following deficits and impairments:  Abnormal gait, Pain, Decreased scar mobility, Decreased mobility, Decreased activity tolerance, Decreased endurance, Decreased range of motion, Decreased strength, Impaired flexibility, Increased edema, Difficulty walking, Decreased balance  Visit Diagnosis: 1. Pain in left hip   2. Stiffness of left hip, not elsewhere classified   3. Difficulty in walking, not elsewhere classified        Problem List Patient Active Problem List   Diagnosis Date Noted  . Status post total replacement of left hip 11/06/2018  . Unilateral primary osteoarthritis, left hip 08/29/2018  . Chronic lymphoblastic leukemia 01/21/2011  . Allergic rhinitis due to pollen 08/16/2010  . CONJUNCTIVITIS, ALLERGIC 01/23/2008    Bess Harvest, PTA 11/21/18 3:39 PM   [facility] [address]  [place], [place], [address] Phone: [phone]   Fax:  [phone]  Name: MICKIE KOZIKOWSKI MRN: [id number] Date of Birth: 12/21/1960

## 2018-11-23 ENCOUNTER — Encounter: Payer: Self-pay | Admitting: Physical Therapy

## 2018-11-23 ENCOUNTER — Other Ambulatory Visit: Payer: Self-pay

## 2018-11-23 ENCOUNTER — Ambulatory Visit: Payer: BC Managed Care – PPO | Admitting: Physical Therapy

## 2018-11-23 DIAGNOSIS — R262 Difficulty in walking, not elsewhere classified: Secondary | ICD-10-CM | POA: Diagnosis not present

## 2018-11-23 DIAGNOSIS — M25552 Pain in left hip: Secondary | ICD-10-CM | POA: Diagnosis not present

## 2018-11-23 DIAGNOSIS — M25652 Stiffness of left hip, not elsewhere classified: Secondary | ICD-10-CM

## 2018-11-23 NOTE — Therapy (Signed)
[facility] [address]  [place], [place], [address] Phone: [phone]   Fax:  [phone]  Physical Therapy Treatment  Patient Details  Name: Christopher Hill MRN: [id number] Date of Birth: [birth year] Referring Provider (PT): Ninfa Linden   Encounter Date: 11/23/2018  PT End of Session - 11/23/18 0954    Visit Number  4    Date for PT Re-Evaluation  01/16/19    PT Start Time  0859    PT Stop Time  0950    PT Time Calculation (min)  51 min    Activity Tolerance  Patient tolerated treatment well    Behavior During Therapy  [facility] for tasks assessed/performed       Past Medical History:  Diagnosis Date  . Allergic rhinitis, cause unspecified   . Chronic lymphoblastic leukemia   . CLL (chronic lymphocytic leukemia) ([place]) 2014  . History of kidney stones   . Other chronic allergic conjunctivitis     Past Surgical History:  Procedure Laterality Date  . CATARACT EXTRACTION    . COLONOSCOPY    . LITHOTRIPSY  1997, 1995  . TONSILLECTOMY  1964  . TOTAL HIP ARTHROPLASTY Left 11/06/2018   Procedure: LEFT TOTAL HIP ARTHROPLASTY ANTERIOR APPROACH;  Surgeon: Mcarthur Rossetti, MD;  Location: [facility];  Service: Orthopedics;  Laterality: Left;    There were no vitals filed for this visit.  Subjective Assessment - 11/23/18 0858    Subjective  Reports that he is feeling stiff this AM. Felt good after last session. Was able to put his shoe on himself today.    Patient Stated Goals  have less pain, less swelling, walk much better, return to gym setting    Currently in Pain?  Yes    Pain Score  3     Pain Location  Hip    Pain Orientation  Left    Pain Descriptors / Indicators  --   stiffness   Pain Type  Acute pain;Surgical pain                       [facility] Adult PT Treatment/Exercise - 11/23/18 0001      Knee/Hip Exercises: Stretches   Passive Hamstring Stretch  Left;1 rep;30 seconds    Passive  Hamstring Stretch Limitations  strap     Hip Flexor Stretch  Left;30 seconds;2 reps    Hip Flexor Stretch Limitations  mod thomas position + strap; hip to neutral    Piriformis Stretch  Left;30 seconds;2 reps    Piriformis Stretch Limitations  KTOS to tolerance with strap    Other Knee/Hip Stretches  supine figure 4 stretch with strap to tolerance 2x30"      Knee/Hip Exercises: Aerobic   Nustep  level 2 x 6 minutes (LEs)      Knee/Hip Exercises: Standing   Terminal Knee Extension  Left;15 reps;Theraband;Strengthening    Theraband Level (Terminal Knee Extension)  Level 4 (Blue)    Terminal Knee Extension Limitations  L TKE at chair    Functional Squat  1 set;10 reps    Functional Squat Limitations  at treadmill rail; heavy R wt shift    SLS  L SLS at treadmill rail to tolerance    Other Standing Knee Exercises  sidestepping with yellow TB at toes 4x length of treadmill rail      Knee/Hip Exercises: Seated   Long Arc Quad  Left;10 reps;Strengthening    Long Arc  Quad Limitations  cues for TKE      Knee/Hip Exercises: Supine   Bridges with [employer]  Both;15 reps   slightly limited ROM   Bridges with Cl[employer]  Strengthening;Both;1 set;15 reps   red TB above knees     Knee/Hip Exercises: Sidelying   Clams  L clam shell x 10 reps with red TB   limited ROM; cues to maintain hips forward            PT Education - 11/23/18 0954    Education Details  update to HEP    Person(s) Educated  Patient    Methods  Explanation;Demonstration;Tactile cues;Verbal cues;Handout    Comprehension  Verbalized understanding;Returned demonstration          PT Long Term Goals - 11/21/18 1440      PT LONG TERM GOAL #1   Title  decrease TUG time to 13 seconds    Time  8    Period  Weeks    Status  On-going      PT LONG TERM GOAL #2   Title  walk without device with minimal deviation    Time  8    Period  Weeks    Status  On-going      PT LONG TERM GOAL #3   Title  independent  with an advanced HEP or a return to the gym    Time  8    Period  Weeks    Status  On-going      PT LONG TERM GOAL #4   Title  increase left hip flexion to 95 degrees    Time  8    Period  Weeks    Status  On-going      PT LONG TERM GOAL #5   Title  report no difficulty putting on socks and shoes    Time  8    Period  Weeks    Status  On-going            Plan - 11/23/18 0955    Clinical Impression Statement  Patient arrived to session with report of stiffness this AM, but able to put his shoe on for the first time today. Reports that he is still having trouble with putting socks on. Worked on very gentle KTOS and figure 4 stretching with strap assistance with improved tolerance after 1st rep. Worked on glute strengthening with introduction of lateral hip resistance- patient tolerating this well and with good form. Patient still with limited TKE with LAQ, thus worked on [employer] with banded resistance. Tolerated introduction of mini squats, although patient with tendency to overuse R LE. Able to tolerate SLS for 10-15sec without LOB. Updated HEP with exercises that were well-tolerated today. Patient reported understanding. Patient declined modalities at end of session d/t no pain.    PT Treatment/Interventions  ADLs/Self Care Home Management;Cryotherapy;Electrical Stimulation;Gait training;Stair training;Functional mobility training;Therapeutic activities;Therapeutic exercise;Balance training;Neuromuscular re-education;Manual techniques;Patient/family education    PT Next Visit Plan  slowly start flexibility, strength and gait; (staples were removed on 11/20/18)    Consulted and Agree with Plan of Care  Patient       Patient will benefit from skilled therapeutic intervention in order to improve the following deficits and impairments:  Abnormal gait, Pain, Decreased scar mobility, Decreased mobility, Decreased activity tolerance, Decreased endurance, Decreased range of motion, Decreased  strength, Impaired flexibility, Increased edema, Difficulty walking, Decreased balance  Visit Diagnosis: 1. Pain in left hip   2. Stiffness of left hip,  not elsewhere classified   3. Difficulty in walking, not elsewhere classified        Problem List Patient Active Problem List   Diagnosis Date Noted  . Status post total replacement of left hip 11/06/2018  . Unilateral primary osteoarthritis, left hip 08/29/2018  . Chronic lymphoblastic leukemia 01/21/2011  . Allergic rhinitis due to pollen 08/16/2010  . CONJUNCTIVITIS, ALLERGIC 01/23/2008    [name], PT, DPT 11/23/18 9:58 AM    [facility] [address]  [place], [place], [address] Phone: [phone]   Fax:  [phone]  Name: Christopher Hill MRN: [id number] Date of Birth: 1960/10/07

## 2018-11-26 ENCOUNTER — Other Ambulatory Visit: Payer: Self-pay

## 2018-11-26 ENCOUNTER — Ambulatory Visit: Payer: BC Managed Care – PPO

## 2018-11-26 DIAGNOSIS — M25552 Pain in left hip: Secondary | ICD-10-CM

## 2018-11-26 DIAGNOSIS — R262 Difficulty in walking, not elsewhere classified: Secondary | ICD-10-CM

## 2018-11-26 DIAGNOSIS — M25652 Stiffness of left hip, not elsewhere classified: Secondary | ICD-10-CM | POA: Diagnosis not present

## 2018-11-26 DIAGNOSIS — Z23 Encounter for immunization: Secondary | ICD-10-CM | POA: Diagnosis not present

## 2018-11-26 DIAGNOSIS — E291 Testicular hypofunction: Secondary | ICD-10-CM | POA: Diagnosis not present

## 2018-11-26 NOTE — Therapy (Signed)
[facility] [address]  [place], [place], [address] Phone: [phone]   Fax:  [phone]  Physical Therapy Treatment  Patient Details  Name: Christopher Hill MRN: [id number] Date of Birth: 04/20/1961 Referring Provider (PT): Ninfa Linden   Encounter Date: 11/26/2018  PT End of Session - 11/26/18 1007    Visit Number  5    Date for PT Re-Evaluation  01/16/19    PT Start Time  1000    PT Stop Time  1045    PT Time Calculation (min)  45 min    Activity Tolerance  Patient tolerated treatment well    Behavior During Therapy  [facility] for tasks assessed/performed       Past Medical History:  Diagnosis Date  . Allergic rhinitis, cause unspecified   . Chronic lymphoblastic leukemia   . CLL (chronic lymphocytic leukemia) ([place]) 2014  . History of kidney stones   . Other chronic allergic conjunctivitis     Past Surgical History:  Procedure Laterality Date  . CATARACT EXTRACTION    . COLONOSCOPY    . LITHOTRIPSY  1997, 1995  . TONSILLECTOMY  1964  . TOTAL HIP ARTHROPLASTY Left 11/06/2018   Procedure: LEFT TOTAL HIP ARTHROPLASTY ANTERIOR APPROACH;  Surgeon: Mcarthur Rossetti, MD;  Location: [facility];  Service: Orthopedics;  Laterality: Left;    There were no vitals filed for this visit.  Subjective Assessment - 11/26/18 1005    Subjective  Pt. reporting he felt the best yesterday that he's felt since surgery.    Patient Stated Goals  have less pain, less swelling, walk much better, return to gym setting    Currently in Pain?  No/denies    Pain Score  0-No pain    Multiple Pain Sites  No         [facility] PT Assessment - 11/26/18 0001      Assessment   Next MD Visit  12/17/18                   [facility] Adult PT Treatment/Exercise - 11/26/18 0001      Knee/Hip Exercises: Stretches   Hip Flexor Stretch  Left;1 rep;60 seconds    Hip Flexor Stretch Limitations  mod thomas position + strap; hip to neutral     Piriformis Stretch  Left;1 rep;60 seconds    Piriformis Stretch Limitations  KTOS to tolerance with strap      Knee/Hip Exercises: Aerobic   Recumbent Bike  Lvl 1, 6 min       Knee/Hip Exercises: Standing   Heel Raises  Both;10 reps;Left    Heel Raises Limitations  B con/L ecc     Forward Step Up  Left;10 reps;Step Height: 6";Hand Hold: 1    Forward Step Up Limitations  Cues required for slow eccentric step back    Step Down  Left;10 reps;Hand Hold: 2;Step Height: 4"    Step Down Limitations  R heel touch to ground   at TM rail    Wall Squat  10 reps;3 seconds    Wall Squat Limitations  mirror for even weight shift       Knee/Hip Exercises: Sidelying   Hip ABduction  Left;2 sets;5 reps;Strengthening    Clams  L clam shell with red TB at knees x 12 rpes                   PT Long Term Goals - 11/21/18 1440  PT LONG TERM GOAL #1   Title  decrease TUG time to 13 seconds    Time  8    Period  Weeks    Status  On-going      PT LONG TERM GOAL #2   Title  walk without device with minimal deviation    Time  8    Period  Weeks    Status  On-going      PT LONG TERM GOAL #3   Title  independent with an advanced HEP or a return to the gym    Time  8    Period  Weeks    Status  On-going      PT LONG TERM GOAL #4   Title  increase left hip flexion to 95 degrees    Time  8    Period  Weeks    Status  On-going      PT LONG TERM GOAL #5   Title  report no difficulty putting on socks and shoes    Time  8    Period  Weeks    Status  On-going            Plan - 11/26/18 1009    Clinical Impression Statement  Pt. doing well today reporting he had the "best day yet since surgery" over weekend and has increased his walking distance.  Progressed LE and proximal hip strengthening activities with addition of wall- sit without issue today.  Ended session with review of modified L hip flexor in seated positioning on corner of table for possible performance at work upon  return to work.  Pt. ended session pain free and progressing well with strengthening activities.    Examination-Activity Limitations  Bathing;Squat;Lift;Stairs;Bend;Locomotion Level;Stand;Toileting;Dressing    Rehab Potential  Good    PT Treatment/Interventions  ADLs/Self Care Home Management;Cryotherapy;Electrical Stimulation;Gait training;Stair training;Functional mobility training;Therapeutic activities;Therapeutic exercise;Balance training;Neuromuscular re-education;Manual techniques;Patient/family education    PT Next Visit Plan  slowly start flexibility, strength and gait; (staples were removed on 11/20/18)    Consulted and Agree with Plan of Care  Patient       Patient will benefit from skilled therapeutic intervention in order to improve the following deficits and impairments:  Abnormal gait, Pain, Decreased scar mobility, Decreased mobility, Decreased activity tolerance, Decreased endurance, Decreased range of motion, Decreased strength, Impaired flexibility, Increased edema, Difficulty walking, Decreased balance  Visit Diagnosis: 1. Pain in left hip   2. Stiffness of left hip, not elsewhere classified   3. Difficulty in walking, not elsewhere classified        Problem List Patient Active Problem List   Diagnosis Date Noted  . Status post total replacement of left hip 11/06/2018  . Unilateral primary osteoarthritis, left hip 08/29/2018  . Chronic lymphoblastic leukemia 01/21/2011  . Allergic rhinitis due to pollen 08/16/2010  . CONJUNCTIVITIS, ALLERGIC 01/23/2008    Bess Harvest, PTA 11/26/18 3:45 PM   [facility] [address]  [place], [place], [address] Phone: [phone]   Fax:  [phone]  Name: KENAZ OLAFSON MRN: [id number] Date of Birth: [birth year]

## 2018-11-27 ENCOUNTER — Telehealth: Payer: Self-pay | Admitting: Orthopaedic Surgery

## 2018-11-27 ENCOUNTER — Other Ambulatory Visit: Payer: Self-pay

## 2018-11-27 MED ORDER — AMOXICILLIN 500 MG PO TABS: ORAL_TABLET | ORAL | 0 refills | Status: DC

## 2018-11-27 NOTE — Telephone Encounter (Signed)
LMOM for patient that I called in some antibiotics for him

## 2018-11-27 NOTE — Telephone Encounter (Signed)
Pt called in said he had an appt with his dentist today but it was canceled until they have a confirmation from dr.blackman that the pt didn't have to wait a certain time period after his surgery or if he needed to be prescribed an antibiotic due to his hip replacement 11-06-2018.  [phone]  Pt is allergic to z-pack and medication that was given to him in the hospital during recovery.

## 2018-11-28 ENCOUNTER — Other Ambulatory Visit: Payer: Self-pay

## 2018-11-28 ENCOUNTER — Ambulatory Visit: Payer: BC Managed Care – PPO

## 2018-11-28 DIAGNOSIS — M25652 Stiffness of left hip, not elsewhere classified: Secondary | ICD-10-CM

## 2018-11-28 DIAGNOSIS — M25552 Pain in left hip: Secondary | ICD-10-CM

## 2018-11-28 DIAGNOSIS — R262 Difficulty in walking, not elsewhere classified: Secondary | ICD-10-CM | POA: Diagnosis not present

## 2018-11-28 NOTE — Therapy (Signed)
[facility] [address]  [place], [place], [address] Phone: [phone]   Fax:  [phone]  Physical Therapy Treatment  Patient Details  Name: Christopher Hill MRN: [id number] Date of Birth: 22-Dec-1960 Referring Provider (PT): Ninfa Linden   Encounter Date: 11/28/2018  PT End of Session - 11/28/18 1106    Visit Number  6    Date for PT Re-Evaluation  01/16/19    PT Start Time  1100    PT Stop Time  1150    PT Time Calculation (min)  50 min    Activity Tolerance  Patient tolerated treatment well    Behavior During Therapy  [facility] for tasks assessed/performed       Past Medical History:  Diagnosis Date  . Allergic rhinitis, cause unspecified   . Chronic lymphoblastic leukemia   . CLL (chronic lymphocytic leukemia) ([place]) 2014  . History of kidney stones   . Other chronic allergic conjunctivitis     Past Surgical History:  Procedure Laterality Date  . CATARACT EXTRACTION    . COLONOSCOPY    . LITHOTRIPSY  1997, 1995  . TONSILLECTOMY  1964  . TOTAL HIP ARTHROPLASTY Left 11/06/2018   Procedure: LEFT TOTAL HIP ARTHROPLASTY ANTERIOR APPROACH;  Surgeon: Mcarthur Rossetti, MD;  Location: [facility];  Service: Orthopedics;  Laterality: Left;    There were no vitals filed for this visit.  Subjective Assessment - 11/28/18 1105    Subjective  Pt. reporting proximal hip soreness after last session however notes he is doing increased distance walking.    Patient Stated Goals  have less pain, less swelling, walk much better, return to gym setting    Currently in Pain?  No/denies    Pain Score  0-No pain    Multiple Pain Sites  No         [facility] PT Assessment - 11/28/18 0001      AROM   AROM Assessment Site  Hip    Right/Left Hip  Left    Left Hip Flexion  88                   [facility] Adult PT Treatment/Exercise - 11/28/18 0001      Knee/Hip Exercises: Stretches   Hip Flexor Stretch  Left;1 rep;60  seconds    Hip Flexor Stretch Limitations  mod thomas position + strap; hip to neutral    Piriformis Stretch  Left;1 rep;60 seconds    Piriformis Stretch Limitations  KTOS to tolerance with strap      Knee/Hip Exercises: Aerobic   Recumbent Bike  Lvl 2, 7 min       Knee/Hip Exercises: Machines for Strengthening   Cybex Knee Extension  B LE's 15# x 15 reps     Cybex Knee Flexion  B LE's 25# x 15 reps     Cybex Leg Press  B LE's 30# x 15 reps       Knee/Hip Exercises: Standing   Forward Step Up  Left;10 reps;Hand Hold: 1    Forward Step Up Limitations  7" step in stair well focusing on eccenttric step-back control     Other Standing Knee Exercises  Alternating cone nock over/righting x 4 reps       Knee/Hip Exercises: Supine   Bridges with Clamshell  Both;1 set;15 reps;Strengthening   5" hold with red TB at knees      Knee/Hip Exercises: Sidelying   Clams  L clam shell  with red TB at knees x 15 reps                  PT Long Term Goals - 11/28/18 1136      PT LONG TERM GOAL #1   Title  decrease TUG time to 13 seconds    Time  8    Period  Weeks    Status  On-going      PT LONG TERM GOAL #2   Title  walk without device with minimal deviation    Time  8    Period  Weeks    Status  On-going      PT LONG TERM GOAL #3   Title  independent with an advanced HEP or a return to the gym    Time  8    Period  Weeks    Status  On-going      PT LONG TERM GOAL #4   Title  increase left hip flexion to 95 degrees    Time  8    Period  Weeks    Status  On-going   11/28/18: 88 dg supine     PT LONG TERM GOAL #5   Title  report no difficulty putting on socks and shoes    Time  8    Period  Weeks    Status  Partially Met   11/28/18: pt. able to don and doff socks and shoes however still noting some difficulty.           Plan - 11/28/18 1106    Clinical Impression Statement  Pt. doing well today noting improved walking tolerance with AD.  Tolerated focus on  Machine LE strengthening activities in session today well able to progress resistance with quad, HS, leg press strengthening.  Demonstrating improved L hip flexor activation with forward step-up to 7" step today however still with notable quad lag with SLR.  Progressing toward hip flexion AROM goal now L hip flexion AROM 88 dg.  Able to partially achieve LTG #5 today.    Examination-Activity Limitations  Bathing;Squat;Lift;Stairs;Bend;Locomotion Level;Stand;Toileting;Dressing    Rehab Potential  Good    PT Treatment/Interventions  ADLs/Self Care Home Management;Cryotherapy;Electrical Stimulation;Gait training;Stair training;Functional mobility training;Therapeutic activities;Therapeutic exercise;Balance training;Neuromuscular re-education;Manual techniques;Patient/family education    PT Next Visit Plan  slowly start flexibility, strength and gait; (staples were removed on 11/20/18)    Consulted and Agree with Plan of Care  Patient       Patient will benefit from skilled therapeutic intervention in order to improve the following deficits and impairments:  Abnormal gait, Pain, Decreased scar mobility, Decreased mobility, Decreased activity tolerance, Decreased endurance, Decreased range of motion, Decreased strength, Impaired flexibility, Increased edema, Difficulty walking, Decreased balance  Visit Diagnosis: 1. Pain in left hip   2. Stiffness of left hip, not elsewhere classified   3. Difficulty in walking, not elsewhere classified        Problem List Patient Active Problem List   Diagnosis Date Noted  . Status post total replacement of left hip 11/06/2018  . Unilateral primary osteoarthritis, left hip 08/29/2018  . Chronic lymphoblastic leukemia 01/21/2011  . Allergic rhinitis due to pollen 08/16/2010  . CONJUNCTIVITIS, ALLERGIC 01/23/2008    Bess Harvest, PTA 11/28/18 12:04 PM   [facility] [address]  [place], [place], [address] Phone: [phone]   Fax:  [phone]  Name: MARVEL MCPHILLIPS MRN: [id number] Date of Birth: 06-01-1961

## 2018-12-03 ENCOUNTER — Other Ambulatory Visit: Payer: Self-pay

## 2018-12-03 ENCOUNTER — Ambulatory Visit: Payer: BC Managed Care – PPO

## 2018-12-03 DIAGNOSIS — M25652 Stiffness of left hip, not elsewhere classified: Secondary | ICD-10-CM | POA: Diagnosis not present

## 2018-12-03 DIAGNOSIS — R262 Difficulty in walking, not elsewhere classified: Secondary | ICD-10-CM

## 2018-12-03 DIAGNOSIS — M25552 Pain in left hip: Secondary | ICD-10-CM | POA: Diagnosis not present

## 2018-12-03 NOTE — Therapy (Signed)
[facility] [address]  [place], [place], [address] Phone: [phone]   Fax:  [phone]  Physical Therapy Treatment  Patient Details  Name:  Christopher Hill MRN: [id number] Date of Birth: 09-13-1960 Referring Provider (PT): Ninfa Linden   Encounter Date: 12/03/2018  PT End of Session - 12/03/18 1015    Visit Number  7    Date for PT Re-Evaluation  01/16/19    PT Start Time  0957    PT Stop Time  1046    PT Time Calculation (min)  49 min    Activity Tolerance  Patient tolerated treatment well    Behavior During Therapy  [facility] for tasks assessed/performed       Past Medical History:  Diagnosis Date  . Allergic rhinitis, cause unspecified   . Chronic lymphoblastic leukemia   . CLL (chronic lymphocytic leukemia) ([place]) 2014  . History of kidney stones   . Other chronic allergic conjunctivitis     Past Surgical History:  Procedure Laterality Date  . CATARACT EXTRACTION    . COLONOSCOPY    . LITHOTRIPSY  1997, 1995  . TONSILLECTOMY  1964  . TOTAL HIP ARTHROPLASTY Left 11/06/2018   Procedure: LEFT TOTAL HIP ARTHROPLASTY ANTERIOR APPROACH;  Surgeon: Mcarthur Rossetti, MD;  Location: [facility];  Service: Orthopedics;  Laterality: Left;    There were no vitals filed for this visit.  Subjective Assessment - 12/03/18 1015    Subjective  Pt. doing well today with no new complaints.  Still noting L anterior thigh "stiffness" in mornings.    Patient Stated Goals  have less pain, less swelling, walk much better, return to gym setting    Currently in Pain?  No/denies    Pain Score  0-No pain    Multiple Pain Sites  No                       [facility] Adult PT Treatment/Exercise - 12/03/18 0001      Knee/Hip Exercises: Stretches   Hip Flexor Stretch  Left;60 seconds;2 reps    Hip Flexor Stretch Limitations  Seated and mod thomas position + strap; hip to neutral      Knee/Hip Exercises: Aerobic    Recumbent Bike  Lvl 2, 7 min       Knee/Hip Exercises: Standing   Other Standing Knee Exercises  Alternating cone nock over/righting x 7 reps; 2# on ankles; down/back     Other Standing Knee Exercises  sidestepping with red TB at ankles 2 x 25 ft; cues required for positioning       Knee/Hip Exercises: Supine   Bridges  Both;15 reps;Strengthening    Bridges with Clamshell  Both;1 set;15 reps;Strengthening   Green TB at knees with isometric hip abd/ER      Knee/Hip Exercises: Sidelying   Clams  L clam shell with green TB at knees x 12 reps      Manual Therapy   Manual Therapy  Soft tissue mobilization    Manual therapy comments  seated     Soft tissue mobilization  "free-up" lotion scar massage to distal end of incision as this area well healed with pt. instruction and pt. verbalizing understanding             PT Education - 12/03/18 1059    Education Details  HEP update; red TB issued to pt. for side stepping    Person(s) Educated  Patient  Methods  Explanation;Demonstration;Verbal cues;Handout    Comprehension  Verbalized understanding;Returned demonstration;Verbal cues required          PT Long Term Goals - 11/28/18 1136      PT LONG TERM GOAL #1   Title  decrease TUG time to 13 seconds    Time  8    Period  Weeks    Status  On-going      PT LONG TERM GOAL #2   Title  walk without device with minimal deviation    Time  8    Period  Weeks    Status  On-going      PT LONG TERM GOAL #3   Title  independent with an advanced HEP or a return to the gym    Time  8    Period  Weeks    Status  On-going      PT LONG TERM GOAL #4   Title  increase left hip flexion to 95 degrees    Time  8    Period  Weeks    Status  On-going   11/28/18: 88 dg supine     PT LONG TERM GOAL #5   Title  report no difficulty putting on socks and shoes    Time  8    Period  Weeks    Status  Partially Met   11/28/18: pt. able to don and doff socks and shoes however still noting  some difficulty.           Plan - 12/03/18 1019    Clinical Impression Statement  Christopher Hill doing well today with gait pattern visibly normalizing without need for [facility] for short community distances.  Tolerated greater focus on standing hip flexor strengthening and SLS stability activities well today without issue.  MT addressing gentle distal scar massage with lotion to distal incision in area well healed.  Pt. verbalizing understanding of scar massage technique.  Ended session with pt. pain free and deferring modalities.  Reports he is now ambulating one mile frequently with wife without soreness.  Progressing well toward goals.    Rehab Potential  Good    PT Treatment/Interventions  ADLs/Self Care Home Management;Cryotherapy;Electrical Stimulation;Gait training;Stair training;Functional mobility training;Therapeutic activities;Therapeutic exercise;Balance training;Neuromuscular re-education;Manual techniques;Patient/family education    PT Next Visit Plan  slowly start flexibility, strength and gait    Consulted and Agree with Plan of Care  Patient       Patient will benefit from skilled therapeutic intervention in order to improve the following deficits and impairments:  Abnormal gait, Pain, Decreased scar mobility, Decreased mobility, Decreased activity tolerance, Decreased endurance, Decreased range of motion, Decreased strength, Impaired flexibility, Increased edema, Difficulty walking, Decreased balance  Visit Diagnosis: 1. Pain in left hip   2. Stiffness of left hip, not elsewhere classified   3. Difficulty in walking, not elsewhere classified        Problem List Patient Active Problem List   Diagnosis Date Noted  . Status post total replacement of left hip 11/06/2018  . Unilateral primary osteoarthritis, left hip 08/29/2018  . Chronic lymphoblastic leukemia 01/21/2011  . Allergic rhinitis due to pollen 08/16/2010  . CONJUNCTIVITIS, ALLERGIC 01/23/2008    Bess Harvest,  PTA 12/03/18 11:14 AM   [facility] [address]  [place], [place], [address] Phone: [phone]   Fax:  [phone]  Name: Christopher Hill MRN: [id number] Date of Birth: 1960/10/10

## 2018-12-06 ENCOUNTER — Other Ambulatory Visit: Payer: Self-pay

## 2018-12-06 ENCOUNTER — Encounter: Payer: Self-pay | Admitting: Physical Therapy

## 2018-12-06 ENCOUNTER — Ambulatory Visit: Payer: BC Managed Care – PPO | Attending: Orthopaedic Surgery | Admitting: Physical Therapy

## 2018-12-06 DIAGNOSIS — M25552 Pain in left hip: Secondary | ICD-10-CM | POA: Diagnosis not present

## 2018-12-06 DIAGNOSIS — R262 Difficulty in walking, not elsewhere classified: Secondary | ICD-10-CM | POA: Insufficient documentation

## 2018-12-06 DIAGNOSIS — M25652 Stiffness of left hip, not elsewhere classified: Secondary | ICD-10-CM | POA: Insufficient documentation

## 2018-12-06 NOTE — Therapy (Signed)
[facility] [address]  [place], [place], [address] Phone: [phone]   Fax:  [phone]  Physical Therapy Treatment  Patient Details  Name: Christopher Hill MRN: [id number] Date of Birth: 03-Sep-1960 Referring Provider (PT): Ninfa Linden   Encounter Date: 12/06/2018  PT End of Session - 12/06/18 1213    Visit Number  8    Date for PT Re-Evaluation  01/16/19    PT Start Time  1016    PT Stop Time  1100    PT Time Calculation (min)  44 min    Activity Tolerance  Patient tolerated treatment well    Behavior During Therapy  [facility] for tasks assessed/performed       Past Medical History:  Diagnosis Date  . Allergic rhinitis, cause unspecified   . Chronic lymphoblastic leukemia   . CLL (chronic lymphocytic leukemia) ([facility]) 2014  . History of kidney stones   . Other chronic allergic conjunctivitis     Past Surgical History:  Procedure Laterality Date  . CATARACT EXTRACTION    . COLONOSCOPY    . LITHOTRIPSY  1997, 1995  . TONSILLECTOMY  1964  . TOTAL HIP ARTHROPLASTY Left 11/06/2018   Procedure: LEFT TOTAL HIP ARTHROPLASTY ANTERIOR APPROACH;  Surgeon: Mcarthur Rossetti, MD;  Location: [place];  Service: Orthopedics;  Laterality: Left;    There were no vitals filed for this visit.  Subjective Assessment - 12/06/18 1015    Subjective  Reports that he is feeling more comfortable without the cane, only uses it for stability. Did a lot of walking yesterday and had swelling in his ankles.    Patient Stated Goals  have less pain, less swelling, walk much better, return to gym setting    Currently in Pain?  Yes    Multiple Pain Sites  Yes    Pain Score  3    Pain Location  Heel    Pain Descriptors / Indicators  --   annoying   Pain Type  Acute pain;Chronic pain         [facility] PT Assessment - 12/06/18 0001      Strength   Strength Assessment Site  Ankle    Right/Left Ankle  Right;Left    Right Ankle Dorsiflexion   4+/5    Right Ankle Plantar Flexion  4+/5    Right Ankle Inversion  4/5    Right Ankle Eversion  4/5    Left Ankle Dorsiflexion  4+/5    Left Ankle Plantar Flexion  5/5    Left Ankle Inversion  4/5    Left Ankle Eversion  4/5                   [facility] Adult PT Treatment/Exercise - 12/06/18 0001      Knee/Hip Exercises: Stretches   Gastroc Stretch  Left;30 seconds;Right;2 reps    Gastroc Stretch Limitations  leaning into counter    Other Knee/Hip Stretches  B plantar fascia stretch at counter top 30" each       Knee/Hip Exercises: Aerobic   Recumbent Bike  Lvl 2, 6 min       Knee/Hip Exercises: Standing   Heel Raises  Both;20 reps    Heel Raises Limitations  with heel ball squeeze    Hip Flexion  Right;Left;Stengthening;10 reps;Knee bent    Hip Flexion Limitations  yellow TB around toes; without UE support   decreased ROM on L   Forward Step Up  Left;10 reps;Hand Hold: 1;Step Height: 8"    Forward Step Up Limitations  good control; intermittent finger support    Functional Squat  10 reps;2 sets    Functional Squat Limitations  at counter top with red TB around knees    SLS  R & L SLS on foam x30"    Other Standing Knee Exercises  single heel raise     Other Standing Knee Exercises  sidestepping with red TB at toes 2 x 25 ft; cues required for positioning              PT Education - 12/06/18 1212    Education Details  update to HEP    Person(s) Educated  Patient    Methods  Explanation;Demonstration;Tactile cues;Verbal cues;Handout    Comprehension  Verbalized understanding;Returned demonstration          PT Long Term Goals - 11/28/18 1136      PT LONG TERM GOAL #1   Title  decrease TUG time to 13 seconds    Time  8    Period  Weeks    Status  On-going      PT LONG TERM GOAL #2   Title  walk without device with minimal deviation    Time  8    Period  Weeks    Status  On-going      PT LONG TERM GOAL #3   Title  independent with an advanced  HEP or a return to the gym    Time  8    Period  Weeks    Status  On-going      PT LONG TERM GOAL #4   Title  increase left hip flexion to 95 degrees    Time  8    Period  Weeks    Status  On-going   11/28/18: 88 dg supine     PT LONG TERM GOAL #5   Title  report no difficulty putting on socks and shoes    Time  8    Period  Weeks    Status  Partially Met   11/28/18: pt. able to don and doff socks and shoes however still noting some difficulty.           Plan - 12/06/18 1213    Clinical Impression Statement  Patient reports that he is feeling more comfortable walking without his cane, now only using it for stability. Having mild L heel pain today, worried he may have plantar fasciitis. Introduced gastroc stretching which was tolerated well. Able to perform progressive hip and buttcom strengthening in standing with intermittent cues to correct form. Patient reporting continued difficulty with SLS exercise at home. Today demonstrating ankle instability with SLS on foam on B LEs, L>R. Formally tested B ankle strength, with patient demonstrating weakness in B inversion and eversion. Updated HEP with 4 way ankle- patient reported understanding. No complaints at end of session.    PT Treatment/Interventions  ADLs/Self Care Home Management;Cryotherapy;Electrical Stimulation;Gait training;Stair training;Functional mobility training;Therapeutic activities;Therapeutic exercise;Balance training;Neuromuscular re-education;Manual techniques;Patient/family education    PT Next Visit Plan  slowly start flexibility, strength and gait    Consulted and Agree with Plan of Care  Patient       Patient will benefit from skilled therapeutic intervention in order to improve the following deficits and impairments:  Abnormal gait, Pain, Decreased scar mobility, Decreased mobility, Decreased activity tolerance, Decreased endurance, Decreased range of motion, Decreased strength, Impaired flexibility, Increased  edema, Difficulty walking, Decreased balance  Visit Diagnosis: 1. Pain in left hip   2. Stiffness of left hip, not elsewhere classified   3. Difficulty in walking, not elsewhere classified        Problem List Patient Active Problem List   Diagnosis Date Noted  . Status post total replacement of left hip 11/06/2018  . Unilateral primary osteoarthritis, left hip 08/29/2018  . Chronic lymphoblastic leukemia 01/21/2011  . Allergic rhinitis due to pollen 08/16/2010  . CONJUNCTIVITIS, ALLERGIC 01/23/2008     [name], PT, DPT 12/06/18 12:18 PM   [facility] [address]  [place], [place], [address] Phone: [phone]   Fax:  [phone]  Name: Christopher Hill MRN: [id number] Date of Birth: Jul 28, 1960

## 2018-12-10 ENCOUNTER — Ambulatory Visit: Payer: BC Managed Care – PPO | Admitting: Physical Therapy

## 2018-12-10 ENCOUNTER — Encounter: Payer: Self-pay | Admitting: Physical Therapy

## 2018-12-10 ENCOUNTER — Other Ambulatory Visit: Payer: Self-pay

## 2018-12-10 DIAGNOSIS — M25652 Stiffness of left hip, not elsewhere classified: Secondary | ICD-10-CM

## 2018-12-10 DIAGNOSIS — R262 Difficulty in walking, not elsewhere classified: Secondary | ICD-10-CM | POA: Diagnosis not present

## 2018-12-10 DIAGNOSIS — M25552 Pain in left hip: Secondary | ICD-10-CM | POA: Diagnosis not present

## 2018-12-10 NOTE — Therapy (Signed)
[facility] [address]  [place], [place], [address] Phone: [phone]   Fax:  [phone]  Physical Therapy Treatment  Patient Details  Name: Christopher Hill MRN: [id number] Date of Birth: 10/11/1960 Referring Provider (PT): Ninfa Linden   Encounter Date: 12/10/2018  PT End of Session - 12/10/18 1153    Visit Number  9    Date for PT Re-Evaluation  01/16/19    PT Start Time  1106    PT Stop Time  1149    PT Time Calculation (min)  43 min    Activity Tolerance  Patient tolerated treatment well    Behavior During Therapy  [name] for tasks assessed/performed       Past Medical History:  Diagnosis Date  . Allergic rhinitis, cause unspecified   . Chronic lymphoblastic leukemia   . CLL (chronic lymphocytic leukemia) ([place]) 2014  . History of kidney stones   . Other chronic allergic conjunctivitis     Past Surgical History:  Procedure Laterality Date  . CATARACT EXTRACTION    . COLONOSCOPY    . LITHOTRIPSY  1997, 1995  . TONSILLECTOMY  1964  . TOTAL HIP ARTHROPLASTY Left 11/06/2018   Procedure: LEFT TOTAL HIP ARTHROPLASTY ANTERIOR APPROACH;  Surgeon: Mcarthur Rossetti, MD;  Location: [facility];  Service: Orthopedics;  Laterality: Left;    There were no vitals filed for this visit.  Subjective Assessment - 12/10/18 1105    Subjective  Reports that he is a little sore today after walking 2 miles yesterday and 1.5 miles today. Was stung by a wasp over L dorsal forearm Saturday.    Patient Stated Goals  have less pain, less swelling, walk much better, return to gym setting                       [name] Adult PT Treatment/Exercise - 12/10/18 0001      Exercises   Exercises  Ankle      Knee/Hip Exercises: Stretches   Hip Flexor Stretch  Left;30 seconds;3 reps    Hip Flexor Stretch Limitations  supine mod thomas with strap      Knee/Hip Exercises: Aerobic   Recumbent Bike  Lvl 3, 6 min        Knee/Hip Exercises: Standing   Other Standing Knee Exercises  sidestepping with red TB around ankles 2x1f    Other Standing Knee Exercises  monster walk with red band around ankles 2x237f     Knee/Hip Exercises: Supine   Bridges  Strengthening;Both;1 set;10 reps    Bridges Limitations  marching bridge     Straight Leg Raises  Strengthening;Left;10 reps;2 sets    Straight Leg Raises Limitations  2nd set with 1#; mild quad lag   L hip flexor "pulling"     Ankle Exercises: Seated   Other Seated Ankle Exercises  R & L 4 way ankle with red TB x15 each   cues to avoid compensation at hip            PT Education - 12/10/18 1148    Education Details  update and consolidation of HEP    Person(s) Educated  Patient    Methods  Explanation;Demonstration;Tactile cues;Verbal cues;Handout    Comprehension  Verbalized understanding;Returned demonstration          PT Long Term Goals - 11/28/18 1136      PT LONG TERM GOAL #1   Title  decrease TUG time  to 13 seconds    Time  8    Period  Weeks    Status  On-going      PT LONG TERM GOAL #2   Title  walk without device with minimal deviation    Time  8    Period  Weeks    Status  On-going      PT LONG TERM GOAL #3   Title  independent with an advanced HEP or a return to the gym    Time  8    Period  Weeks    Status  On-going      PT LONG TERM GOAL #4   Title  increase left hip flexion to 95 degrees    Time  8    Period  Weeks    Status  On-going   11/28/18: 88 dg supine     PT LONG TERM GOAL #5   Title  report no difficulty putting on socks and shoes    Time  8    Period  Weeks    Status  Partially Met   11/28/18: pt. able to don and doff socks and shoes however still noting some difficulty.           Plan - 12/10/18 1153    Clinical Impression Statement  Patient reporting mild soreness after walking for exercise yesterday and today. Reviewed 4 way ankle with banded resistance- patient demonstrated slightly  decreased L ankle eccentric control with this exercise. Introduced Museum/gallery exhibitions officer- patient with symmetrical hip lift. Introduced SLR with patient struggling with 1#, better tolerance without weight. Worked on Metallurgist HEP as patient reporting HEP taking too long. Reported understanding of all updates and without complaints at end of session. Patient progressing well.    PT Treatment/Interventions  ADLs/Self Care Home Management;Cryotherapy;Electrical Stimulation;Gait training;Stair training;Functional mobility training;Therapeutic activities;Therapeutic exercise;Balance training;Neuromuscular re-education;Manual techniques;Patient/family education    PT Next Visit Plan  slowly start flexibility, strength and gait    Consulted and Agree with Plan of Care  Patient       Patient will benefit from skilled therapeutic intervention in order to improve the following deficits and impairments:  Abnormal gait, Pain, Decreased scar mobility, Decreased mobility, Decreased activity tolerance, Decreased endurance, Decreased range of motion, Decreased strength, Impaired flexibility, Increased edema, Difficulty walking, Decreased balance  Visit Diagnosis: 1. Pain in left hip   2. Stiffness of left hip, not elsewhere classified   3. Difficulty in walking, not elsewhere classified        Problem List Patient Active Problem List   Diagnosis Date Noted  . Status post total replacement of left hip 11/06/2018  . Unilateral primary osteoarthritis, left hip 08/29/2018  . Chronic lymphoblastic leukemia 01/21/2011  . Allergic rhinitis due to pollen 08/16/2010  . CONJUNCTIVITIS, ALLERGIC 01/23/2008     [name], PT, DPT 12/10/18 11:57 AM   [facility] [address]  [place], [place], [address] Phone: [phone]   Fax:  [phone]  Name: Christopher Hill MRN: [id number] Date of Birth: 1961/03/16

## 2018-12-13 ENCOUNTER — Encounter: Payer: Self-pay | Admitting: Physical Therapy

## 2018-12-13 ENCOUNTER — Other Ambulatory Visit: Payer: Self-pay

## 2018-12-13 ENCOUNTER — Ambulatory Visit: Payer: BC Managed Care – PPO | Admitting: Physical Therapy

## 2018-12-13 DIAGNOSIS — M25652 Stiffness of left hip, not elsewhere classified: Secondary | ICD-10-CM

## 2018-12-13 DIAGNOSIS — M25552 Pain in left hip: Secondary | ICD-10-CM

## 2018-12-13 DIAGNOSIS — R262 Difficulty in walking, not elsewhere classified: Secondary | ICD-10-CM | POA: Diagnosis not present

## 2018-12-13 NOTE — Therapy (Addendum)
[facility] [address] [facility] [address]  [place], [place], [address] Phone: [phone]   Fax:  [phone]  Physical Therapy Progress Note  Patient Details  Name: Christopher Hill MRN: [id number] Date of Birth: 1960/08/09 Referring Provider (PT): Ninfa Linden   Progress Note Reporting Period 11/16/18 to 12/13/18  See note below for Objective Data and Assessment of Progress/Goals.    Encounter Date: 12/13/2018  PT End of Session - 12/13/18 1134    Visit Number  10    Date for PT Re-Evaluation  01/16/19    PT Start Time  1108   pt late   PT Stop Time  1131    PT Time Calculation (min)  23 min    Activity Tolerance  Patient tolerated treatment well    Behavior During Therapy  [facility] for tasks assessed/performed       Past Medical History:  Diagnosis Date  . Allergic rhinitis, cause unspecified   . Chronic lymphoblastic leukemia   . CLL (chronic lymphocytic leukemia) ([place]) 2014  . History of kidney stones   . Other chronic allergic conjunctivitis     Past Surgical History:  Procedure Laterality Date  . CATARACT EXTRACTION    . COLONOSCOPY    . LITHOTRIPSY  1997, 1995  . TONSILLECTOMY  1964  . TOTAL HIP ARTHROPLASTY Left 11/06/2018   Procedure: LEFT TOTAL HIP ARTHROPLASTY ANTERIOR APPROACH;  Surgeon: Mcarthur Rossetti, MD;  Location: [facility];  Service: Orthopedics;  Laterality: Left;    There were no vitals filed for this visit.  Subjective Assessment - 12/13/18 1110    Subjective  Patient reports that he walked 2.5 miles this AM and just having a little bit of soreness. Feels that he is ready to wrap up with PT. Reports that he is 90-95% improved since initial eval. Still having a little bit of stiffness.    Patient Stated Goals  have less pain, less swelling, walk much better, return to gym setting    Currently in Pain?  No/denies         [facility] PT Assessment - 12/13/18 0001      Observation/Other Assessments    Focus on Therapeutic Outcomes (FOTO)   Hip: 85 (15% limited, 31% predict)      AROM   AROM Assessment Site  Hip    Right/Left Hip  Left    Left Hip Flexion  106      Timed Up and Go Test   TUG  Normal TUG    Normal TUG (seconds)  8.16    TUG Comments  no AD                   [facility] Adult PT Treatment/Exercise - 12/13/18 0001      Knee/Hip Exercises: Aerobic   Recumbent Bike  Lvl 3, 6 min              PT Education - 12/13/18 1134    Education Details  discussion on objective progress with PT    Person(s) Educated  Patient    Methods  Explanation    Comprehension  Verbalized understanding          PT Long Term Goals - 12/13/18 1114      PT LONG TERM GOAL #1   Title  decrease TUG time to 13 seconds    Time  8    Period  Weeks    Status  Achieved   8.16 sec without AD  PT LONG TERM GOAL #2   Title  walk without device with minimal deviation    Time  8    Period  Weeks    Status  Achieved   normal gait pattern without deviations     PT LONG TERM GOAL #3   Title  independent with an advanced HEP or a return to the gym    Time  8    Period  Weeks    Status  Achieved   gyms not open but walking daily and performing HEP     PT LONG TERM GOAL #4   Title  increase left hip flexion to 95 degrees    Time  8    Period  Weeks    Status  Achieved   106 degrees     PT LONG TERM GOAL #5   Title  report no difficulty putting on socks and shoes    Time  8    Period  Weeks    Status  Partially Met   able to don and doff socks and shoes however still noting mild difficulty.           Plan - 12/13/18 1141    Clinical Impression Statement  Patient reports 90-95% improvement, with only some remaining stiffness in the L hip. Reports that he walked 2.5 miles this AM. Patient has met or partially met all goals at this time. Now demonstrating a normal gait pattern without deviations. Scored 8.16 sec on TUG testing without AD, indicating decreased  risk of falls. Patient now reaching 106 degrees of L hip flexion AROM and reports ability to don socks and shoes with remaining mild difficulty. Patient has not attended gym as most gyms are not open d/t COVID, but walking daily and performing HEP. HEP updated and consolidated last session and patient without questions. Ended session without complaints. Placing patient on 30 day hold at this time d/t meeting goals.    PT Treatment/Interventions  ADLs/Self Care Home Management;Cryotherapy;Electrical Stimulation;Gait training;Stair training;Functional mobility training;Therapeutic activities;Therapeutic exercise;Balance training;Neuromuscular re-education;Manual techniques;Patient/family education    PT Next Visit Plan  30 day hold    Consulted and Agree with Plan of Care  Patient       Patient will benefit from skilled therapeutic intervention in order to improve the following deficits and impairments:  Abnormal gait, Pain, Decreased scar mobility, Decreased mobility, Decreased activity tolerance, Decreased endurance, Decreased range of motion, Decreased strength, Impaired flexibility, Increased edema, Difficulty walking, Decreased balance  Visit Diagnosis: 1. Pain in left hip   2. Stiffness of left hip, not elsewhere classified   3. Difficulty in walking, not elsewhere classified        Problem List Patient Active Problem List   Diagnosis Date Noted  . Status post total replacement of left hip 11/06/2018  . Unilateral primary osteoarthritis, left hip 08/29/2018  . Chronic lymphoblastic leukemia 01/21/2011  . Allergic rhinitis due to pollen 08/16/2010  . CONJUNCTIVITIS, ALLERGIC 01/23/2008    [name], PT, DPT 12/13/18 11:43 AM    [facility] [address]  [place], [place], [address] Phone: [phone]   Fax:  [phone]  Name: Christopher Hill MRN: [id number] Date of Birth: [birth year]  PHYSICAL THERAPY  DISCHARGE SUMMARY  Visits from Start of Care: 10  Current functional level related to goals / functional outcomes: See above clinical impression; patient did not return after 30 day hold   Remaining deficits: Mild difficulty donning socks/shoes  Education / Equipment: HEP  Plan: Patient agrees to discharge.  Patient goals were partially met. Patient is being discharged due to meeting the stated rehab goals.  ?????     [name], PT, DPT 01/17/19 4:36 PM

## 2018-12-17 ENCOUNTER — Other Ambulatory Visit: Payer: Self-pay

## 2018-12-17 ENCOUNTER — Ambulatory Visit (INDEPENDENT_AMBULATORY_CARE_PROVIDER_SITE_OTHER): Payer: BC Managed Care – PPO | Admitting: Physician Assistant

## 2018-12-17 ENCOUNTER — Encounter: Payer: Self-pay | Admitting: Physician Assistant

## 2018-12-17 DIAGNOSIS — Z96642 Presence of left artificial hip joint: Secondary | ICD-10-CM

## 2018-12-17 NOTE — Progress Notes (Signed)
HPI: Christopher Hill returns today status post left total hip arthroplasty 11/06/2018.  He is overall doing well.  He did have some questions about his incision and states that he developed a knot at the top.  Not having a lot of pain.  Is walking 2 to 3 miles a daily.  Did get the formal therapy and feels that this is been beneficial.  She requested a night return to work on 12/19/2018 full duties without restrictions.  Physical exam: Left hip good range of motion.  Calf supple nontender.  Dorsiflexion plantarflexion ankle intact.  Surgical incisions healing well he did develop slight keloid at the proximal portion of the incision.  No signs of infection or dehiscence.  Impression: Status post left total hip arthroplasty  Plan: He will work on scar tissue mobilization.  Continue work on range of motion strengthening.  We will see him back at 1 year postop at that time obtain an AP pelvis and a lateral view of the left hip.  He will follow-up with [place] sooner if there is any questions or concerns.  He is given a note to return to work full duties on 12/19/2018.

## 2018-12-20 ENCOUNTER — Encounter: Payer: BC Managed Care – PPO | Admitting: Physical Therapy

## 2019-01-07 DIAGNOSIS — M722 Plantar fascial fibromatosis: Secondary | ICD-10-CM | POA: Diagnosis not present

## 2019-01-07 DIAGNOSIS — M6702 Short Achilles tendon (acquired), left ankle: Secondary | ICD-10-CM | POA: Diagnosis not present

## 2019-01-24 ENCOUNTER — Telehealth: Payer: Self-pay | Admitting: Orthopaedic Surgery

## 2019-01-24 NOTE — Telephone Encounter (Signed)
Christopher Hill with Dr Ezzie Dural office called needing note faxed over stating patient do not need an antibiotic prior to dental appointment on Monday. The fax# is [phone]  The ph# is [phone]

## 2019-01-25 ENCOUNTER — Other Ambulatory Visit: Payer: Self-pay

## 2019-01-25 MED ORDER — AMOXICILLIN 500 MG PO TABS: ORAL_TABLET | ORAL | 0 refills | Status: DC

## 2019-01-25 NOTE — Telephone Encounter (Signed)
Faxed note to provided number Patient aware to take antibiotics until mid September for his dental cleanings

## 2019-02-05 DIAGNOSIS — H16143 Punctate keratitis, bilateral: Secondary | ICD-10-CM | POA: Diagnosis not present

## 2019-02-15 DIAGNOSIS — M79672 Pain in left foot: Secondary | ICD-10-CM | POA: Diagnosis not present

## 2019-02-15 DIAGNOSIS — M629 Disorder of muscle, unspecified: Secondary | ICD-10-CM | POA: Diagnosis not present

## 2019-03-01 DIAGNOSIS — M79672 Pain in left foot: Secondary | ICD-10-CM | POA: Diagnosis not present

## 2019-03-01 DIAGNOSIS — M629 Disorder of muscle, unspecified: Secondary | ICD-10-CM | POA: Diagnosis not present

## 2019-03-01 DIAGNOSIS — M722 Plantar fascial fibromatosis: Secondary | ICD-10-CM | POA: Diagnosis not present

## 2019-03-04 DIAGNOSIS — E291 Testicular hypofunction: Secondary | ICD-10-CM | POA: Diagnosis not present

## 2019-03-06 DIAGNOSIS — E291 Testicular hypofunction: Secondary | ICD-10-CM | POA: Diagnosis not present

## 2019-03-06 DIAGNOSIS — N529 Male erectile dysfunction, unspecified: Secondary | ICD-10-CM | POA: Diagnosis not present

## 2019-04-17 DIAGNOSIS — L570 Actinic keratosis: Secondary | ICD-10-CM | POA: Diagnosis not present

## 2019-04-17 DIAGNOSIS — D485 Neoplasm of uncertain behavior of skin: Secondary | ICD-10-CM | POA: Diagnosis not present

## 2019-05-13 DIAGNOSIS — E291 Testicular hypofunction: Secondary | ICD-10-CM | POA: Diagnosis not present

## 2019-08-05 DIAGNOSIS — Z79899 Other long term (current) drug therapy: Secondary | ICD-10-CM | POA: Diagnosis not present

## 2019-08-05 DIAGNOSIS — Z95828 Presence of other vascular implants and grafts: Secondary | ICD-10-CM | POA: Diagnosis not present

## 2019-08-05 DIAGNOSIS — Z87442 Personal history of urinary calculi: Secondary | ICD-10-CM | POA: Diagnosis not present

## 2019-08-05 DIAGNOSIS — C9110 Chronic lymphocytic leukemia of B-cell type not having achieved remission: Secondary | ICD-10-CM | POA: Diagnosis not present

## 2019-08-05 DIAGNOSIS — J302 Other seasonal allergic rhinitis: Secondary | ICD-10-CM | POA: Diagnosis not present

## 2019-08-05 DIAGNOSIS — R7303 Prediabetes: Secondary | ICD-10-CM | POA: Diagnosis not present

## 2019-08-19 DIAGNOSIS — Z79899 Other long term (current) drug therapy: Secondary | ICD-10-CM | POA: Diagnosis not present

## 2019-08-19 DIAGNOSIS — Z0000 Encounter for general adult medical examination without abnormal findings: Secondary | ICD-10-CM | POA: Diagnosis not present

## 2019-08-19 DIAGNOSIS — E7800 Pure hypercholesterolemia, unspecified: Secondary | ICD-10-CM | POA: Diagnosis not present

## 2019-08-26 DIAGNOSIS — C9110 Chronic lymphocytic leukemia of B-cell type not having achieved remission: Secondary | ICD-10-CM | POA: Diagnosis not present

## 2019-08-26 DIAGNOSIS — R7309 Other abnormal glucose: Secondary | ICD-10-CM | POA: Diagnosis not present

## 2019-08-26 DIAGNOSIS — Z0000 Encounter for general adult medical examination without abnormal findings: Secondary | ICD-10-CM | POA: Diagnosis not present

## 2019-08-26 DIAGNOSIS — Z1212 Encounter for screening for malignant neoplasm of rectum: Secondary | ICD-10-CM | POA: Diagnosis not present

## 2019-08-26 DIAGNOSIS — Z125 Encounter for screening for malignant neoplasm of prostate: Secondary | ICD-10-CM | POA: Diagnosis not present

## 2019-08-26 DIAGNOSIS — E7800 Pure hypercholesterolemia, unspecified: Secondary | ICD-10-CM | POA: Diagnosis not present

## 2019-08-26 DIAGNOSIS — Z23 Encounter for immunization: Secondary | ICD-10-CM | POA: Diagnosis not present

## 2019-08-26 DIAGNOSIS — S50811A Abrasion of right forearm, initial encounter: Secondary | ICD-10-CM | POA: Diagnosis not present

## 2019-08-26 DIAGNOSIS — E291 Testicular hypofunction: Secondary | ICD-10-CM | POA: Diagnosis not present

## 2019-09-12 DIAGNOSIS — H16143 Punctate keratitis, bilateral: Secondary | ICD-10-CM | POA: Diagnosis not present

## 2019-10-08 DIAGNOSIS — L218 Other seborrheic dermatitis: Secondary | ICD-10-CM | POA: Diagnosis not present

## 2019-11-18 ENCOUNTER — Ambulatory Visit: Payer: BC Managed Care – PPO | Admitting: Physician Assistant

## 2019-11-25 ENCOUNTER — Ambulatory Visit: Payer: BC Managed Care – PPO | Admitting: Physician Assistant

## 2019-11-25 ENCOUNTER — Other Ambulatory Visit: Payer: Self-pay

## 2019-11-25 ENCOUNTER — Ambulatory Visit: Payer: Self-pay

## 2019-11-25 ENCOUNTER — Encounter: Payer: Self-pay | Admitting: Physician Assistant

## 2019-11-25 DIAGNOSIS — Z96642 Presence of left artificial hip joint: Secondary | ICD-10-CM | POA: Diagnosis not present

## 2019-11-25 NOTE — Progress Notes (Signed)
Office Visit Note   Patient: Christopher Hill           Date of Birth: 01-12-1961           MRN: [id number] Visit Date: 11/25/2019              Requested by: Deland Pretty, MD [place],  [place] [address] PCP: Deland Pretty, MD   Assessment & Plan: Visit Diagnoses:  1. Status post total replacement of left hip     Plan: He will follow up with [place] on an as-needed basis.  Questions encouraged and answered at length.  Follow-Up Instructions: No follow-ups on file.   Orders:  No orders of the defined types were placed in this encounter.  No orders of the defined types were placed in this encounter.     Procedures: No procedures performed   Clinical Data: No additional findings.   Subjective: No chief complaint on file.   HPI Mr. Minshall returns today just over a year status post left total hip arthroplasty.  He has some numbness to the touch around the incision site and some twitching at times around the incision site 2.  Otherwise doing well.  Has no complaints in regards to the left hip. Review of Systems See HPI otherwise negative or noncontributory.  Objective: Vital Signs: There were no vitals taken for this visit.  Physical Exam Pulmonary:     Effort: Pulmonary effort is normal.  Neurological:     Mental Status: He is alert.  Psychiatric:        Mood and Affect: Mood normal.     Ortho Exam Left hip good range of motion without pain.  Surgical incisions healed well no signs of infection.  Slight tenderness over the left trochanteric region. Specialty Comments:  No specialty comments available.  Imaging: AP pelvis lateral view of the left hip: Status post left total hip arthroplasty with well-seated components.  No concerning features.  No acute fractures.  PMFS History: Patient Active Problem List   Diagnosis Date Noted   Status post total replacement of left hip 11/06/2018   Unilateral primary osteoarthritis, left hip  08/29/2018   Chronic lymphoblastic leukemia 01/21/2011   Allergic rhinitis due to pollen 08/16/2010   CONJUNCTIVITIS, ALLERGIC 01/23/2008   Past Medical History:  Diagnosis Date   Allergic rhinitis, cause unspecified    Chronic lymphoblastic leukemia    CLL (chronic lymphocytic leukemia) ([place]) 2014   History of kidney stones    Other chronic allergic conjunctivitis     Family History  Problem Relation Age of Onset   Heart disease Mother        arterial   Allergies Mother    Otitis media Mother    Stroke Father    Other Father        CABG-valve replacement   Coronary artery disease Father    Diabetes Maternal Grandmother    Heart disease Maternal Grandmother    Lung cancer Maternal Grandmother    Colon cancer Maternal Grandmother 102   Prostate cancer Maternal Grandfather     Past Surgical History:  Procedure Laterality Date   CATARACT EXTRACTION     COLONOSCOPY     LITHOTRIPSY  1997, [place] HIP ARTHROPLASTY Left 11/06/2018   Procedure: LEFT TOTAL HIP ARTHROPLASTY ANTERIOR APPROACH;  Surgeon: Mcarthur Rossetti, MD;  Location: [place];  Service: Orthopedics;  Laterality: Left;   Social History   Occupational  History    Employer: [employer]  Tobacco Use   Smoking status: Never Smoker   Smokeless tobacco: Never Used  Vaping Use   Vaping Use: Never used  Substance and Sexual Activity   Alcohol use: No   Drug use: No   Sexual activity: Not on file

## 2020-08-31 ENCOUNTER — Other Ambulatory Visit: Payer: Self-pay | Admitting: Internal Medicine

## 2020-08-31 DIAGNOSIS — E7800 Pure hypercholesterolemia, unspecified: Secondary | ICD-10-CM

## 2020-08-31 DIAGNOSIS — R7303 Prediabetes: Secondary | ICD-10-CM

## 2020-09-21 ENCOUNTER — Encounter: Payer: Self-pay | Admitting: Internal Medicine

## 2020-10-09 ENCOUNTER — Ambulatory Visit
Admission: RE | Admit: 2020-10-09 | Discharge: 2020-10-09 | Disposition: A | Discharge status: Home / Self Care | Payer: Self-pay | Source: Ambulatory Visit | Attending: Internal Medicine | Admitting: Internal Medicine

## 2020-10-09 DIAGNOSIS — E7800 Pure hypercholesterolemia, unspecified: Secondary | ICD-10-CM

## 2020-10-15 ENCOUNTER — Other Ambulatory Visit: Payer: Self-pay | Admitting: Internal Medicine

## 2020-10-15 DIAGNOSIS — K7689 Other specified diseases of liver: Secondary | ICD-10-CM

## 2020-11-05 ENCOUNTER — Ambulatory Visit
Admission: RE | Admit: 2020-11-05 | Discharge: 2020-11-05 | Disposition: A | Discharge status: Home / Self Care | Payer: No Typology Code available for payment source | Source: Ambulatory Visit | Attending: Internal Medicine | Admitting: Internal Medicine

## 2020-11-05 DIAGNOSIS — K7689 Other specified diseases of liver: Secondary | ICD-10-CM

## 2020-12-01 ENCOUNTER — Ambulatory Visit: Payer: BC Managed Care – PPO | Admitting: Internal Medicine

## 2020-12-01 ENCOUNTER — Encounter: Payer: Self-pay | Admitting: Internal Medicine

## 2020-12-01 VITALS — BP 130/80 | HR 80 | Ht 70.5 in | Wt 214.8 lb

## 2020-12-01 DIAGNOSIS — R194 Change in bowel habit: Secondary | ICD-10-CM

## 2020-12-01 DIAGNOSIS — K625 Hemorrhage of anus and rectum: Secondary | ICD-10-CM

## 2020-12-01 DIAGNOSIS — R12 Heartburn: Secondary | ICD-10-CM | POA: Diagnosis not present

## 2020-12-01 DIAGNOSIS — E8881 Metabolic syndrome: Secondary | ICD-10-CM

## 2020-12-01 DIAGNOSIS — R140 Abdominal distension (gaseous): Secondary | ICD-10-CM | POA: Diagnosis not present

## 2020-12-01 DIAGNOSIS — K760 Fatty (change of) liver, not elsewhere classified: Secondary | ICD-10-CM

## 2020-12-01 NOTE — Progress Notes (Signed)
Christopher Hill 60 y.o. 02/21/1961 [id number]  Assessment & Plan:   Encounter Diagnoses  Name Primary?   Bloating Yes   Rectal bleeding    Change in bowel habit    Heartburn    Metabolic syndrome    NAFLD (nonalcoholic fatty liver disease)     All of his problems may well be due to his metabolic syndrome and associated issues.  However change in bowel habits bleeding and early satiety symptoms merit an endoscopic evaluation so he will have an EGD and a colonoscopy.  Prior to that since it sounds like he might have small intestinal bacterial overgrowth we will do a lactulose hydrogen breath test.  I have given him information regarding healthier eating to improve his metabolic health, I think a lower carbohydrate diet makes sense.  He does not seem to have excessive sugar ingestion in the form of sodas or other beverages though detailed diet history not taken.  Clearly he is someone who looks like they have suffered the effects of the standard American diet that has been recommended for years i.e. low-fat.  I explained how we are reaching the conclusion release some of [place] are that that was incorrect and it would be healthier to have a diet higher in healthy fats and proteins and lower in processed carbohydrates but to include plenty of vegetables.  I appreciate the opportunity to care for this patient. CC: Deland Pretty, MD   Subjective:   Chief Complaint: Bloating  HPI  Patient is here with a lot of bloating symptoms.  He is having some increased flatus as well as belching and heartburn.  Some constipation in addition to the symptoms.  No weight loss.  In fact he has been referred to the nutritionist or dietitian at Dr. Pennie Banter office to try to lose about 25 pounds.  Had a colonoscopy in 2014 and a Cologuard in 2020.  He is having some intermittent rectal bleeding that he thinks is probably hemorrhoids.  He had an anal canal fibroepithelial polyp in 2014. There is been no change in  medication or diet.  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  Other symptoms include some early satiety no dysphagia.  Wt Readings from Last 3 Encounters:  12/01/20 214 lb 12.8 oz (97.4 kg)  11/06/18 214 lb 1.1 oz (97.1 kg)  10/30/18 211 lb 8 oz (95.9 kg)    Maternal grandmother had colon cancer and her mother had colon polyps.  Recent ultrasound abdomen complete with echogenic liver suggesting steatosis and 2 small mildly complex hepatic cysts CT calcium score 171 74th percentile  Allergies  Allergen Reactions   Azithromycin Nausea And Vomiting    REACTION: GI upset   Current Meds  Medication Sig   aspirin 81 MG chewable tablet Chew 1 tablet (81 mg total) by mouth 2 (two) times daily.   atorvastatin (LIPITOR) 40 MG tablet Take 40 mg by mouth daily.   fexofenadine (ALLEGRA) 180 MG tablet Take 180 mg by mouth daily.   fluticasone (FLONASE) 50 MCG/ACT nasal spray Place 2 sprays into both nostrils daily.   meloxicam (MOBIC) 7.5 MG tablet Take 7.5 mg by mouth daily.   Multiple Vitamin (MULTIVITAMIN) tablet Take 1 tablet by mouth daily.     Olopatadine HCl 0.2 % SOLN Place 1 drop into both eyes daily as needed (  eye allergies).    testosterone cypionate (DEPOTESTOSTERONE CYPIONATE) 200 MG/ML injection Inject 1 mL into the muscle every 14 (fourteen) days.   Past Medical History:  Diagnosis Date   Allergic rhinitis, cause unspecified    Arthritis    Chronic lymphoblastic leukemia    CLL (chronic lymphocytic leukemia) ([place]) 2014   History of kidney stones    Hyperlipidemia    Liver cyst    Other chronic allergic conjunctivitis    Past Surgical History:  Procedure Laterality Date   CATARACT EXTRACTION Bilateral    COLONOSCOPY     LITHOTRIPSY  1997, 1995   TONSILLECTOMY  06/06/1962   TOTAL HIP ARTHROPLASTY Left 11/06/2018   Procedure: LEFT  TOTAL HIP ARTHROPLASTY ANTERIOR APPROACH;  Surgeon: Mcarthur Rossetti, MD;  Location: [facility];  Service: Orthopedics;  Laterality: Left;   Social History   Social History Narrative   Patient is married he has 1 daughter      He is in the Audiological scientist as an Psychologist, clinical for [employer] 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   Review of Systems As per HPI has some seasonal allergy problems he has significant improvement in hip pain since he had a left hip arthroplasty.  Objective:   Physical Exam BP 130/80   Pulse 80   Ht 5' 10.5" (1.791 m)   Wt 214 lb 12.8 oz (97.4 kg)   BMI 30.38 kg/m  NAD Lungs cta Cor NL Abd soft and nontender does not appear to have a large amount of belly fat no organomegaly or mass Rect deferred Alert and oriented x3

## 2020-12-01 NOTE — Patient Instructions (Signed)
If you are age 62 or younger, your body mass index should be between 19-25. Your Body mass index is 30.38 kg/m. If this is out of the aformentioned range listed, please consider follow up with your Primary Care Provider.   __________________________________________________________  The [place] [facility] providers would like to encourage you to use [facility] to communicate with providers for non-urgent requests or questions.  Due to long hold times on the telephone, sending your provider a message by [facility] may be a faster and more efficient way to get a response.  Please allow 48 business hours for a response.  Please remember that this is for non-urgent requests.    You have been scheduled for an endoscopy and colonoscopy. Please follow the written instructions given to you at your visit today. Please pick up your prep supplies at the pharmacy within the next 1-3 days. If you use inhalers (even only as needed), please bring them with you on the day of your procedure.  You have been given a testing kit to check for small intestine bacterial overgrowth (SIBO) which is completed by a company named [name]. Make sure to return your test in the mail using the return mailing label given to you along with the kit. Your demographic and insurance information have already been sent to the company and they should be in contact with you over the next week regarding this test. [name] will collect an upfront charge of $99.74 for commercial insurance plans and $209.74 is you are paying cash. Make sure to discuss with [name] PRIOR to having the test if they have gotten informatoin from your insurance company as to how much your testing will cost out of pocket, if any. Please keep in mind that you will be getting a call from phone number [phone] or a similar number. If you do not hear from them within this time frame, please call our office at [phone].    I appreciate the opportunity to  care for you. Silvano Rusk, MD, [facility]

## 2020-12-30 ENCOUNTER — Telehealth: Payer: Self-pay | Admitting: Internal Medicine

## 2020-12-30 NOTE — Telephone Encounter (Signed)
Inbound call from patient. States he have not heard anything for[facility] regarding the SIBO test

## 2020-12-30 NOTE — Telephone Encounter (Signed)
Christopher Hill, will you please follow up on this and contact the patient

## 2020-12-30 NOTE — Telephone Encounter (Signed)
I have left a message for [facility] to call to me back to check on the status on his SIBO test kit order. Christopher Hill is aware of this. His ECL is going to be done 01-12-21.

## 2020-12-31 NOTE — Telephone Encounter (Signed)
I left Christopher Hill a detailed message that [facility] called me back and said they can't mail out the kits with the Lactulose, the patient has to go to a pharmacy and get the lactulose. Anyway we have kits here now so I will hold him one and see if he wants to come by and get it or just wait until his procedure in early August to get it.

## 2021-01-05 ENCOUNTER — Encounter: Payer: Self-pay | Admitting: Internal Medicine

## 2021-01-12 ENCOUNTER — Ambulatory Visit (AMBULATORY_SURGERY_CENTER): Payer: BC Managed Care – PPO | Admitting: Internal Medicine

## 2021-01-12 ENCOUNTER — Other Ambulatory Visit: Payer: Self-pay

## 2021-01-12 ENCOUNTER — Encounter: Payer: Self-pay | Admitting: Internal Medicine

## 2021-01-12 VITALS — BP 123/77 | HR 74 | Temp 98.6°F | Resp 15 | Ht 70.5 in | Wt 214.0 lb

## 2021-01-12 DIAGNOSIS — K2100 Gastro-esophageal reflux disease with esophagitis, without bleeding: Secondary | ICD-10-CM

## 2021-01-12 DIAGNOSIS — K6289 Other specified diseases of anus and rectum: Secondary | ICD-10-CM

## 2021-01-12 DIAGNOSIS — K2970 Gastritis, unspecified, without bleeding: Secondary | ICD-10-CM

## 2021-01-12 DIAGNOSIS — K209 Esophagitis, unspecified without bleeding: Secondary | ICD-10-CM

## 2021-01-12 DIAGNOSIS — K648 Other hemorrhoids: Secondary | ICD-10-CM | POA: Diagnosis not present

## 2021-01-12 DIAGNOSIS — K317 Polyp of stomach and duodenum: Secondary | ICD-10-CM

## 2021-01-12 DIAGNOSIS — K3189 Other diseases of stomach and duodenum: Secondary | ICD-10-CM

## 2021-01-12 DIAGNOSIS — K625 Hemorrhage of anus and rectum: Secondary | ICD-10-CM | POA: Diagnosis present

## 2021-01-12 DIAGNOSIS — R194 Change in bowel habit: Secondary | ICD-10-CM | POA: Diagnosis not present

## 2021-01-12 DIAGNOSIS — K2990 Gastroduodenitis, unspecified, without bleeding: Secondary | ICD-10-CM

## 2021-01-12 DIAGNOSIS — R12 Heartburn: Secondary | ICD-10-CM

## 2021-01-12 DIAGNOSIS — R140 Abdominal distension (gaseous): Secondary | ICD-10-CM

## 2021-01-12 MED ORDER — SODIUM CHLORIDE 0.9 % IV SOLN: 500.0000 mL | Freq: Once | INTRAVENOUS | Status: DC

## 2021-01-12 NOTE — Progress Notes (Signed)
Medical history reviewed with no changes noted. VS assessed by A.S 

## 2021-01-12 NOTE — Patient Instructions (Addendum)
Good for you on the eating changes! Keep it up.  Up top there was the following: Suspected inflammation where esophagus and stomach meet Stomach inflammation Stomach polyps  All biopsied.  The colonoscopy revealed possible rectal inflammation (proctitis) biopsied and hemorrhoids. No polyps or cancer.  Once I see the results will contact with recommendations.  I appreciate the opportunity to care for you. Gatha Mayer, MD, FACG  YOU HAD AN ENDOSCOPIC PROCEDURE TODAY AT [facility]:   Refer to the procedure report that was given to you for any specific questions about what was found during the examination.  If the procedure report does not answer your questions, please call your gastroenterologist to clarify.  If you requested that your care partner not be given the details of your procedure findings, then the procedure report has been included in a sealed envelope for you to review at your convenience later.  YOU SHOULD EXPECT: Some feelings of bloating in the abdomen. Passage of more gas than usual.  Walking can help get rid of the air that was put into your GI tract during the procedure and reduce the bloating. If you had a lower endoscopy (such as a colonoscopy or flexible sigmoidoscopy) you may notice spotting of blood in your stool or on the toilet paper. If you underwent a bowel prep for your procedure, you may not have a normal bowel movement for a few days.  Please Note:  You might notice some irritation and congestion in your nose or some drainage.  This is from the oxygen used during your procedure.  There is no need for concern and it should clear up in a day or so.  SYMPTOMS TO REPORT IMMEDIATELY:  Following lower endoscopy (colonoscopy or flexible sigmoidoscopy):  Excessive amounts of blood in the stool  Significant tenderness or worsening of abdominal pains  Swelling of the abdomen that is new, acute  Fever of 100F or higher  Following upper endoscopy  (EGD)  Vomiting of blood or coffee ground material  New chest pain or pain under the shoulder blades  Painful or persistently difficult swallowing  New shortness of breath  Fever of 100F or higher  Black, tarry-looking stools  For urgent or emergent issues, a gastroenterologist can be reached at any hour by calling [phone]. Do not use MyChart messaging for urgent concerns.    DIET:  We do recommend a small meal at first, but then you may proceed to your regular diet.  Drink plenty of fluids but you should avoid alcoholic beverages for 24 hours.  ACTIVITY:  You should plan to take it easy for the rest of today and you should NOT DRIVE or use heavy machinery until tomorrow (because of the sedation medicines used during the test).    FOLLOW UP: Our staff will call the number listed on your records 48-72 hours following your procedure to check on you and address any questions or concerns that you may have regarding the information given to you following your procedure. If we do not reach you, we will leave a message.  We will attempt to reach you two times.  During this call, we will ask if you have developed any symptoms of COVID 19. If you develop any symptoms (ie: fever, flu-like symptoms, shortness of breath, cough etc.) before then, please call [phone].  If you test positive for Covid 19 in the 2 weeks post procedure, please call and report this information to [place].    If any  biopsies were taken you will be contacted by phone or by letter within the next 1-3 weeks.  Please call us at [phone] if you have not heard about the biopsies in 3 weeks.    SIGNATURES/CONFIDENTIALITY: You and/or your care partner have signed paperwork which will be entered into your electronic medical record.  These signatures attest to the fact that that the information above on your After Visit Summary has been reviewed and is understood.  Full responsibility of the confidentiality of this discharge  information lies with you and/or your care-partner.

## 2021-01-12 NOTE — Progress Notes (Signed)
Called to room to assist during endoscopic procedure.  Patient ID and intended procedure confirmed with present staff. Received instructions for my participation in the procedure from the performing physician.  

## 2021-01-12 NOTE — Progress Notes (Signed)
[facility] History and Physical   Primary Care Physician:  Deland Pretty, MD   Reason for Procedure:    Encounter Diagnoses  Name Primary?   Bloating Yes   Rectal bleeding    Change in bowel habit    Heartburn      Plan:    EGD and colonoscopy     HPI: Christopher Hill is a 60 y.o. male  Seen 12/01/20 - as below - no changes  Chief Complaint: Bloating   HPI   Patient is here with a lot of bloating symptoms.  He is having some increased flatus as well as belching and heartburn.  Some constipation in addition to the symptoms.  No weight loss.  In fact he has been referred to the nutritionist or dietitian at Dr. Pennie Banter office to try to lose about 25 pounds.  Had a colonoscopy in 2014 and a Cologuard in 2020.  He is having some intermittent rectal bleeding that he thinks is probably hemorrhoids.  He had an anal canal fibroepithelial polyp in 2014. There is been no change in medication or diet.  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   Other symptoms include some early satiety no dysphagia.      Wt Readings from Last 3 Encounters:  12/01/20 214 lb 12.8 oz (97.4 kg)  11/06/18 214 lb 1.1 oz (97.1 kg)  10/30/18 211 lb 8 oz (95.9 kg)      Maternal grandmother had colon cancer and her mother had colon polyps.   Recent ultrasound abdomen complete with echogenic liver suggesting steatosis and 2 small mildly complex hepatic cysts CT calcium score 171 74th percentile     Past Medical History:  Diagnosis Date   Allergic rhinitis, cause unspecified    Arthritis    Chronic lymphoblastic leukemia    CLL (chronic lymphocytic leukemia) ([place]) 2014   History of kidney stones    Hyperlipidemia    Liver cyst    Other chronic allergic conjunctivitis     Past Surgical History:  Procedure Laterality Date   CATARACT EXTRACTION  Bilateral    COLONOSCOPY     LITHOTRIPSY  1997, 1995   TONSILLECTOMY  06/06/1962   TOTAL HIP ARTHROPLASTY Left 11/06/2018   Procedure: LEFT TOTAL HIP ARTHROPLASTY ANTERIOR APPROACH;  Surgeon: Mcarthur Rossetti, MD;  Location: [facility];  Service: Orthopedics;  Laterality: Left;    Prior to Admission medications   Medication Sig Start Date End Date Taking? Authorizing Provider  aspirin 81 MG chewable tablet Chew 1 tablet (81 mg total) by mouth 2 (two) times daily. 11/08/18  Yes Mcarthur Rossetti, MD  atorvastatin (LIPITOR) 40 MG tablet Take 40 mg by mouth daily.   Yes [provider]  fexofenadine (ALLEGRA) 180 MG tablet Take 180 mg by mouth daily.   Yes [provider]  fluticasone (FLONASE) 50 MCG/ACT nasal spray Place 2 sprays into both nostrils daily.   Yes [provider]  meloxicam (MOBIC) 7.5 MG tablet Take 7.5 mg by mouth daily.   Yes [provider]  Multiple Vitamin (MULTIVITAMIN) tablet Take 1 tablet by mouth daily.     Yes [provider]  Olopatadine HCl 0.2 % SOLN Place 1 drop into both eyes daily as needed (eye allergies).  10/05/18  Yes [provider]  testosterone cypionate (DEPOTESTOSTERONE CYPIONATE) 200  MG/ML injection Inject 1 mL into the muscle every 14 (fourteen) days. 08/03/19   [provider]    Current Outpatient Medications  Medication Sig Dispense Refill   aspirin 81 MG chewable tablet Chew 1 tablet (81 mg total) by mouth 2 (two) times daily. 30 tablet 0   atorvastatin (LIPITOR) 40 MG tablet Take 40 mg by mouth daily.     fexofenadine (ALLEGRA) 180 MG tablet Take 180 mg by mouth daily.     fluticasone (FLONASE) 50 MCG/ACT nasal spray Place 2 sprays into both nostrils daily.     meloxicam (MOBIC) 7.5 MG tablet Take 7.5 mg by mouth daily.     Multiple Vitamin (MULTIVITAMIN) tablet Take 1 tablet by mouth daily.       Olopatadine HCl 0.2 % SOLN Place 1 drop into both eyes daily as needed (eye  allergies).      testosterone cypionate (DEPOTESTOSTERONE CYPIONATE) 200 MG/ML injection Inject 1 mL into the muscle every 14 (fourteen) days.     Current Facility-Administered Medications  Medication Dose Route Frequency Provider Last Rate Last Admin   0.9 %  sodium chloride infusion  500 mL Intravenous Once Gatha Mayer, MD        Allergies as of 01/12/2021 - Review Complete 01/12/2021  Allergen Reaction Noted   Azithromycin Nausea And Vomiting     Family History  Problem Relation Age of Onset   Heart disease Mother        arterial   Allergies Mother    Otitis media Mother    Colon polyps Mother    Stroke Father    Other Father        CABG-valve replacement   Coronary artery disease Father        bypass   Diabetes Maternal Grandmother    Heart disease Maternal Grandmother    Lung cancer Maternal Grandmother    Colon cancer Maternal Grandmother 70   Prostate cancer Maternal Grandfather    Diabetes Maternal Grandfather    Heart disease Maternal Grandfather    Esophageal cancer Neg Hx    Stomach cancer Neg Hx    Pancreatic cancer Neg Hx    Liver disease Neg Hx     Social History   Socioeconomic History   Marital status: Married    Spouse name: Not on file   Number of children: 1   Years of education: Not on file   Highest education level: Not on file  Occupational History    Employer: [employer]  Tobacco Use   Smoking status: Never   Smokeless tobacco: Never  Vaping Use   Vaping Use: Never used  Substance and Sexual Activity   Alcohol use: No   Drug use: No   Sexual activity: Not on file  Other Topics Concern   Not on file  Social History Narrative   Patient is married he has 1 daughter      He is in the Audiological scientist as an Psychologist, clinical for [employer] 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  Physical Exam: Vital signs in last 24 hours: BP (!) 143/80   Pulse (!) 101   Temp 98.6 F (37 C) (Skin)   Ht 5' 10.5" (1.791 m)   Wt 214 lb (97.1 kg)   SpO2 98%   BMI 30.27 kg/m     General:   Alert,  Well-developed, well-nourished, pleasant and cooperative in NAD Lungs:  Clear throughout to auscultation.   Heart:  Regular rate and rhythm; no murmurs, clicks, rubs,  or gallops. Abdomen:  Soft, nontender and nondistended. Normal bowel sounds.   Neuro/Psych:  Alert and cooperative. Normal mood and affect. A and O x 3   '@Armel Rabbani'$  Simonne Maffucci, MD, [facility] [phone] (pager) 01/12/2021 4:15 PM@

## 2021-01-12 NOTE — Progress Notes (Signed)
VSS NAD transferred to pacu rn

## 2021-01-12 NOTE — Op Note (Signed)
[facility] Patient Name: Christopher Hill Procedure Date: 01/12/2021 4:07 PM MRN: [id number] Endoscopist: Gatha Mayer , MD Age: 60 Referring MD:  Date of Birth: 29-Aug-1960 Gender: Male Account #: [id number] Procedure:                Upper GI endoscopy Indications:              Heartburn, Abdominal bloating Medicines:                Propofol per Anesthesia, Monitored Anesthesia Care Procedure:                Pre-Anesthesia Assessment:                           - Prior to the procedure, a History and Physical                            was performed, and patient medications and                            allergies were reviewed. The patient's tolerance of                            previous anesthesia was also reviewed. The risks                            and benefits of the procedure and the sedation                            options and risks were discussed with the patient.                            All questions were answered, and informed consent                            was obtained. Prior Anticoagulants: The patient has                            taken no previous anticoagulant or antiplatelet                            agents. ASA Grade Assessment: III - A patient with                            severe systemic disease. After reviewing the risks                            and benefits, the patient was deemed in                            satisfactory condition to undergo the procedure.                           After obtaining informed consent, the endoscope was  passed under direct vision. Throughout the                            procedure, the patient's blood pressure, pulse, and                            oxygen saturations were monitored continuously. The                            Endoscope was introduced through the mouth, and                            advanced to the second part of duodenum. The upper                            GI  endoscopy was accomplished without difficulty.                            The patient tolerated the procedure well. Scope In: Scope Out: Findings:                 LA Grade A (one or more mucosal breaks less than 5                            mm, not extending between tops of 2 mucosal folds)                            esophagitis was found at the gastroesophageal                            junction. Biopsies were taken with a cold forceps                            for histology. Verification of patient                            identification for the specimen was done. Estimated                            blood loss was minimal.                           A few diminutive sessile polyps were found in the                            gastric body. Biopsies were taken with a cold                            forceps for histology. Verification of patient                            identification for the specimen was done. Estimated  blood loss was minimal.                           Scattered mucosal changes characterized by erosion,                            granularity, inflammation and ulceration were found                            in the prepyloric region of the stomach. Biopsies                            were taken with a cold forceps for histology.                            Verification of patient identification for the                            specimen was done. Estimated blood loss was minimal.                           The examined duodenum was normal.                           The cardia and gastric fundus were normal on                            retroflexion. Complications:            No immediate complications. Estimated Blood Loss:     Estimated blood loss was minimal. Impression:               - LA Grade A reflux esophagitis. Biopsied.                           - A few gastric polyps. Biopsied.                           - Eroded, granular,  inflamed and ulcerated mucosa                            in the prepyloric region of the stomach. Biopsied.                           - Normal examined duodenum. Recommendation:           - Await pathology results.                           - See the other procedure note for documentation of                            additional recommendations. Gatha Mayer, MD 01/12/2021 4:57:53 PM This report has been signed electronically.

## 2021-01-12 NOTE — Op Note (Signed)
[facility] Patient Name: Christopher Hill Procedure Date: 01/12/2021 3:59 PM MRN: [id number] Endoscopist: Gatha Mayer , MD Age: 60 Referring MD:  Date of Birth: [birth year] Gender: Male Account #: [id number] Procedure:                Colonoscopy Indications:              Rectal bleeding Medicines:                Propofol per Anesthesia, Monitored Anesthesia Care Procedure:                Pre-Anesthesia Assessment:                           - Prior to the procedure, a History and Physical                            was performed, and patient medications and                            allergies were reviewed. The patient's tolerance of                            previous anesthesia was also reviewed. The risks                            and benefits of the procedure and the sedation                            options and risks were discussed with the patient.                            All questions were answered, and informed consent                            was obtained. Prior Anticoagulants: The patient has                            taken no previous anticoagulant or antiplatelet                            agents. ASA Grade Assessment: III - A patient with                            severe systemic disease. After reviewing the risks                            and benefits, the patient was deemed in                            satisfactory condition to undergo the procedure.                           After obtaining informed consent, the colonoscope  was passed under direct vision. Throughout the                            procedure, the patient's blood pressure, pulse, and                            oxygen saturations were monitored continuously. The                            Olympus CF-HQ190L (Serial# [id number]) Colonoscope was                            introduced through the anus and advanced to the the                            cecum, identified  by appendiceal orifice and                            ileocecal valve. The colonoscopy was performed                            without difficulty. The patient tolerated the                            procedure well. The quality of the bowel                            preparation was good. The ileocecal valve,                            appendiceal orifice, and rectum were photographed. Scope In: 4:28:12 PM Scope Out: 4:45:28 PM Scope Withdrawal Time: 0 hours 11 minutes 43 seconds  Total Procedure Duration: 0 hours 17 minutes 16 seconds  Findings:                 The perianal and digital rectal examinations were                            normal. Pertinent negatives include normal prostate                            (size, shape, and consistency).                           An area of mildly congested, erythematous, eroded                            and inflamed mucosa was found in the distal rectum.                            Biopsies were taken with a cold forceps for                            histology. Verification of patient identification  for the specimen was done. Estimated blood loss was                            minimal.                           External and internal hemorrhoids were found.                           The exam was otherwise without abnormality on                            direct and retroflexion views. Complications:            No immediate complications. Estimated Blood Loss:     Estimated blood loss was minimal. Impression:               - Congested, erythematous, eroded and inflamed                            mucosa in the distal rectum. Biopsied.                           - External and internal hemorrhoids. +                            hypertrophied anal papilla                           - The examination was otherwise normal on direct                            and retroflexion views. Recommendation:           - Patient has a  contact number available for                            emergencies. The signs and symptoms of potential                            delayed complications were discussed with the                            patient. Return to normal activities tomorrow.                            Written discharge instructions were provided to the                            patient.                           - Resume previous diet.                           - Continue present medications.                           -  Repeat colonoscopy is recommended. The                            colonoscopy date will be determined after pathology                            results from today's exam become available for                            review. Gatha Mayer, MD 01/12/2021 5:00:22 PM This report has been signed electronically.

## 2021-01-13 NOTE — Telephone Encounter (Signed)
Patient had colon yesterday and per Dr Carlean Purl we will put this test on hold for now.

## 2021-01-14 ENCOUNTER — Telehealth: Payer: Self-pay

## 2021-01-14 ENCOUNTER — Telehealth: Payer: Self-pay | Admitting: *Deleted

## 2021-01-14 NOTE — Telephone Encounter (Signed)
  Follow up Call-  Call back number 01/12/2021  Post procedure Call Back phone  # [phone]  Permission to leave phone message Yes  Some recent data might be hidden     1st follow up call made.  NALM

## 2021-01-14 NOTE — Telephone Encounter (Signed)
  Follow up Call-  Call back number 01/12/2021  Post procedure Call Back phone  # [phone]  Permission to leave phone message Yes  Some recent data might be hidden    LMOM to call back with any questions or concerns.  Also, call back if patient has developed fever, respiratory issues or been dx with COVID or had any family members or close contacts diagnosed since her procedure.

## 2021-02-02 ENCOUNTER — Other Ambulatory Visit: Payer: Self-pay

## 2021-02-02 MED ORDER — OMEPRAZOLE 20 MG PO CPDR: 20.0000 mg | DELAYED_RELEASE_CAPSULE | Freq: Every day | ORAL | 3 refills | Status: DC

## 2021-04-07 ENCOUNTER — Encounter: Payer: Self-pay | Admitting: Internal Medicine

## 2021-04-07 ENCOUNTER — Ambulatory Visit (INDEPENDENT_AMBULATORY_CARE_PROVIDER_SITE_OTHER): Payer: BC Managed Care – PPO | Admitting: Internal Medicine

## 2021-04-07 VITALS — BP 150/82 | HR 68 | Ht 70.5 in | Wt 212.2 lb

## 2021-04-07 DIAGNOSIS — K649 Unspecified hemorrhoids: Secondary | ICD-10-CM | POA: Diagnosis not present

## 2021-04-07 DIAGNOSIS — K760 Fatty (change of) liver, not elsewhere classified: Secondary | ICD-10-CM | POA: Diagnosis not present

## 2021-04-07 DIAGNOSIS — K2100 Gastro-esophageal reflux disease with esophagitis, without bleeding: Secondary | ICD-10-CM

## 2021-04-07 NOTE — Patient Instructions (Signed)
Keep up the good work on changing eating habits. Staying off sugar and processed foods is key.  Hopefully the fiber supplementation and diet  changes will help keep hemorrhoids from bleeding.  You may try to come off the omeprazole at some point to see if you can do without it or possibly use as needed.  I appreciate the opportunity to care for you. Gatha Mayer, MD, [facility]

## 2021-04-07 NOTE — Progress Notes (Signed)
Christopher Hill 60 y.o. [birth year] [id number]  Assessment & Plan:   Encounter Diagnoses  Name Primary?   Gastroesophageal reflux disease with esophagitis without hemorrhage Yes   Bleeding hemorrhoids    NAFLD (nonalcoholic fatty liver disease)    Continue omeprazole for now as he works on his diet etc. he could try to come off it I do not think he needs to be committed to lifelong therapy.  Obviously if he is unable to maintain symptom-free or minimal symptoms off the medication he may need to restart it or use it as needed.  He will pick a point in time to try this.  Continue diet changes and fiber supplementations for hemorrhoids and also eating changes for his nonalcoholic fatty liver disease in the form of reducing sugar and processed foods.  Continue to follow-up with primary care and see me as needed.    Subjective:   Chief Complaint: hemorrhoids + GERD  HPI Patient seen in follow-up after he had EGD and colonoscopy.  January 12, 2021.  He was having a lot of heartburn issues and bloating and rectal bleeding.  He had grade a reflux esophagitis and is much better in fact asymptomatic on omeprazole 20 mg daily.  He is watching his diet with respect to GERD and he has been trying to eliminate sugars and reduce processed foods related to nonalcoholic fatty liver disease and suspected metabolic syndrome issues.  He is making progress with that though a wife's relative died and they had to travel and schedule got off so he was not as good but he is back on track.  Colonoscopy revealed internal and external hemorrhoids and hypertrophied anal papillae, there was a question of proctitis but I really think based upon the findings and the biopsy results that this was a prep effect and not a disease process.  Hemorrhoids are not bleeding as much he is working on having softer easier defecation and has purchased 0 sugar Metamucil and is about to start that.  There is no pain or other worrisome  features for him and he is satisfied trying conservative therapy. Allergies  Allergen Reactions   Azithromycin Nausea And Vomiting    REACTION: GI upset   Current Meds  Medication Sig   aspirin 81 MG chewable tablet Chew 1 tablet (81 mg total) by mouth 2 (two) times daily.   atorvastatin (LIPITOR) 40 MG tablet Take 40 mg by mouth daily.   fexofenadine (ALLEGRA) 180 MG tablet Take 180 mg by mouth daily.   fluticasone (FLONASE) 50 MCG/ACT nasal spray Place 2 sprays into both nostrils daily.   meloxicam (MOBIC) 7.5 MG tablet Take 7.5 mg by mouth daily.   Multiple Vitamin (MULTIVITAMIN) tablet Take 1 tablet by mouth daily.     Olopatadine HCl 0.2 % SOLN Place 1 drop into both eyes daily as needed (eye allergies).    omeprazole (PRILOSEC) 20 MG capsule Take 1 capsule (20 mg total) by mouth daily.   testosterone cypionate (DEPOTESTOSTERONE CYPIONATE) 200 MG/ML injection Inject 1 mL into the muscle every 14 (fourteen) days.   Past Medical History:  Diagnosis Date   Allergic rhinitis, cause unspecified    Arthritis    Chronic lymphoblastic leukemia    CLL (chronic lymphocytic leukemia) ([facility]) 2014   History of kidney stones    Hyperlipidemia    Liver cyst    Other chronic allergic conjunctivitis    Past Surgical History:  Procedure Laterality Date   CATARACT EXTRACTION Bilateral  COLONOSCOPY     ESOPHAGOGASTRODUODENOSCOPY     LITHOTRIPSY  1997, 1995   TONSILLECTOMY  06/06/1962   TOTAL HIP ARTHROPLASTY Left 11/06/2018   Procedure: LEFT TOTAL HIP ARTHROPLASTY ANTERIOR APPROACH;  Surgeon: Mcarthur Rossetti, MD;  Location: [facility];  Service: Orthopedics;  Laterality: Left;   Social History   Social History Narrative   Patient is married he has 1 daughter      He is in the Audiological scientist as an Psychologist, clinical for [employer] 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 but 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   Review of Systems As per HPI  Objective:   Physical Exam BP (!) 150/82   Pulse 68   Ht 5' 10.5" (1.791 m)   Wt 212 lb 3.2 oz (96.3 kg)   BMI 30.02 kg/m

## 2022-01-07 ENCOUNTER — Other Ambulatory Visit: Payer: Self-pay | Admitting: Internal Medicine

## 2022-08-16 ENCOUNTER — Encounter: Payer: Self-pay | Admitting: Physician Assistant

## 2022-08-16 ENCOUNTER — Ambulatory Visit: Payer: 59 | Admitting: Physician Assistant

## 2022-08-16 ENCOUNTER — Ambulatory Visit (INDEPENDENT_AMBULATORY_CARE_PROVIDER_SITE_OTHER): Payer: 59

## 2022-08-16 DIAGNOSIS — M25551 Pain in right hip: Secondary | ICD-10-CM | POA: Diagnosis not present

## 2022-08-16 DIAGNOSIS — M5441 Lumbago with sciatica, right side: Secondary | ICD-10-CM

## 2022-08-16 MED ORDER — GABAPENTIN 300 MG PO CAPS: 300.0000 mg | ORAL_CAPSULE | Freq: Every day | ORAL | 1 refills | Status: DC

## 2022-08-16 MED ORDER — METHOCARBAMOL 500 MG PO TABS: 500.0000 mg | ORAL_TABLET | Freq: Every evening | ORAL | 0 refills | Status: DC

## 2022-08-16 NOTE — Progress Notes (Signed)
Office Visit Note   Patient: Christopher Hill           Date of Birth: 11/19/1960           MRN: [id number] Visit Date: 08/16/2022              Requested by: Deland Pretty, MD [address] [place],  [place] [address] PCP: Deland Pretty, MD   Assessment & Plan: Visit Diagnoses:  1. Pain of right hip   2. Acute right-sided low back pain with right-sided sciatica     Plan: Send him to formal physical therapy for his lumbar spine to work on range of motion, core strengthening, stretching, include modalities and home exercise program.  Will see him back in 5 weeks to see how he is doing overall.  Did place him on Neurontin and Robaxin.  He is like to stay overnight for steroids as he has had multiple rounds of steroids over the years.  Questions were encouraged and answered.  Discussed with him reasons to return earlier.  Follow-Up Instructions: Return in about 5 weeks (around 09/20/2022).   Orders:  Orders Placed This Encounter  Procedures   XR Lumbar Spine 2-3 Views   XR HIP UNILAT W OR W/O PELVIS 2-3 VIEWS RIGHT   Meds ordered this encounter  Medications   methocarbamol (ROBAXIN) 500 MG tablet    Sig: Take 1 tablet (500 mg total) by mouth at bedtime.    Dispense:  30 tablet    Refill:  0   gabapentin (NEURONTIN) 300 MG capsule    Sig: Take 1 capsule (300 mg total) by mouth at bedtime.    Dispense:  30 capsule    Refill:  1      Procedures: No procedures performed   Clinical Data: No additional findings.   Subjective: Chief Complaint  Patient presents with   Lower Back - Pain   Right Hip - Pain    HPI Christopher Hill comes in today for new complaint of right leg pain it has been ongoing past 3 to 4 weeks.  He feels like it is the lateral side of his hip down into his calf where he feels as if he is got water running down the side of his leg.  Does not go beyond the ankle.  He describes it as throbbing toothache like pain.  He denies any groin pain.  He  does have a history of a left total hip arthroplasty that was performed by Dr. Delilah Shan on 11/06/2018 and is doing well.  He denies any waking pain.  Denies any fevers chills.  Denies bowel or bladder dysfunction, saddle anesthesia like symptoms.  He has had weight loss but this was intentional and he is lost some 10 pounds over considerable amount of time.  Ranks his pain to be constant and 5 out of 10.  He has tried stretching at the gym.  He is also tried Tylenol and Motrin without any real relief.  Pain is worse with standing or walking.  He notes some low back pain. Review of Systems See HPI or otherwise negative  Objective: Vital Signs: There were no vitals taken for this visit.  Physical Exam Constitutional:      Appearance: He is normal weight.  Cardiovascular:     Pulses: Normal pulses.  Pulmonary:     Effort: Pulmonary effort is normal.  Neurological:     Mental Status: He is alert and oriented to person, place, and time.  Psychiatric:        Mood and Affect: Mood normal.     Ortho Exam Bilateral hips good range of motion of both hips.  Nontender over the trochanteric region bilaterally.  Lumbar spine he has good range of motion lumbar spine with flexion and extension.  Tenderness over the right lower lumbar paraspinous region.  5 out of 5 strength throughout the lower extremities except for right great toe which she has 4 out of 5 strength with dorsiflexion against resistance.  Negative straight leg raise bilaterally.  Sensation grossly intact bilateral feet light touch. Specialty Comments:  No specialty comments available.  Imaging: XR Lumbar Spine 2-3 Views  Result Date: 08/16/2022 Lumbar spine 2 views: Lower lumbar spine facet changes.  Degenerative disc disease at L5-S1 and L3-4.  Endplate spurring throughout the lumbar spine.  No acute fractures.  No spondylolisthesis.  XR HIP UNILAT W OR W/O PELVIS 2-3 VIEWS RIGHT  Result Date: 08/16/2022 AP pelvis lateral view right hip:  Bilateral hips are well located.  Status post left total hip arthroplasty well-seated components.  No acute fractures of either hip.  Right hip joints well-maintained.    PMFS History: Patient Active Problem List   Diagnosis Date Noted   Status post total replacement of left hip 11/06/2018   Unilateral primary osteoarthritis, left hip 08/29/2018   Chronic lymphoblastic leukemia 01/21/2011   Allergic rhinitis due to pollen 08/16/2010   CONJUNCTIVITIS, ALLERGIC 01/23/2008   Past Medical History:  Diagnosis Date   Allergic rhinitis, cause unspecified    Arthritis    Chronic lymphoblastic leukemia    CLL (chronic lymphocytic leukemia) ([facility]) 2014   History of kidney stones    Hyperlipidemia    Liver cyst    Other chronic allergic conjunctivitis     Family History  Problem Relation Age of Onset   Heart disease Mother        arterial   Allergies Mother    Otitis media Mother    Colon polyps Mother    Stroke Father    Other Father        CABG-valve replacement   Coronary artery disease Father        bypass   Diabetes Maternal Grandmother    Heart disease Maternal Grandmother    Lung cancer Maternal Grandmother    Colon cancer Maternal Grandmother 70   Prostate cancer Maternal Grandfather    Diabetes Maternal Grandfather    Heart disease Maternal Grandfather    Esophageal cancer Neg Hx    Stomach cancer Neg Hx    Pancreatic cancer Neg Hx    Liver disease Neg Hx     Past Surgical History:  Procedure Laterality Date   CATARACT EXTRACTION Bilateral    COLONOSCOPY     ESOPHAGOGASTRODUODENOSCOPY     LITHOTRIPSY  1997, 1995   TONSILLECTOMY  06/06/1962   TOTAL HIP ARTHROPLASTY Left 11/06/2018   Procedure: LEFT TOTAL HIP ARTHROPLASTY ANTERIOR APPROACH;  Surgeon: Mcarthur Rossetti, MD;  Location: [facility];  Service: Orthopedics;  Laterality: Left;   Social History   Occupational History    Employer: [employer]  Tobacco Use   Smoking status: Never   Smokeless  tobacco: Never  Vaping Use   Vaping Use: Never used  Substance and Sexual Activity   Alcohol use: No   Drug use: No   Sexual activity: Not on file

## 2022-08-17 NOTE — Addendum Note (Signed)
Addended by: Robyne Peers on: 08/17/2022 08:25 AM   Modules accepted: Orders

## 2022-08-19 NOTE — Telephone Encounter (Signed)
Sw pt and he will wait until he hears from someone in the [facility] location

## 2022-08-26 NOTE — Therapy (Signed)
OUTPATIENT PHYSICAL THERAPY THORACOLUMBAR EVALUATION   Patient Name: Christopher Hill MRN: [id number] DOB:1961-05-22, 62 y.o., male Today's Date: 08/30/2022  END OF SESSION:  PT End of Session - 08/30/22 0940     Visit Number 1    Date for PT Re-Evaluation 10/11/22    Authorization Type UHC    PT Start Time 0940   Pt completing admission paperwork   PT Stop Time 1028    PT Time Calculation (min) 48 min    Activity Tolerance Patient tolerated treatment well    Behavior During Therapy WFL for tasks assessed/performed             Past Medical History:  Diagnosis Date   Allergic rhinitis, cause unspecified    Arthritis    Chronic lymphoblastic leukemia    CLL (chronic lymphocytic leukemia) ([place]) 2014   History of kidney stones    Hyperlipidemia    Liver cyst    Other chronic allergic conjunctivitis    Past Surgical History:  Procedure Laterality Date   CATARACT EXTRACTION Bilateral    COLONOSCOPY     ESOPHAGOGASTRODUODENOSCOPY     LITHOTRIPSY  1997, 1995   TONSILLECTOMY  06/06/1962   TOTAL HIP ARTHROPLASTY Left 11/06/2018   Procedure: LEFT TOTAL HIP ARTHROPLASTY ANTERIOR APPROACH;  Surgeon: Mcarthur Rossetti, MD;  Location: [facility];  Service: Orthopedics;  Laterality: Left;   Patient Active Problem List   Diagnosis Date Noted   Status post total replacement of left hip 11/06/2018   Unilateral primary osteoarthritis, left hip 08/29/2018   Chronic lymphoblastic leukemia 01/21/2011   Allergic rhinitis due to pollen 08/16/2010   CONJUNCTIVITIS, ALLERGIC 01/23/2008    PCP: Deland Pretty, MD   REFERRING PROVIDER: Pete Pelt, PA-C   REFERRING DIAG: [phone] (ICD-10-CM) - Acute right-sided low back pain with right-sided sciatica   THERAPY DIAG:  Acute right-sided low back pain with right-sided sciatica  Radiculopathy, lumbar region  Muscle weakness (generalized)  Muscle spasm of back  RATIONALE FOR EVALUATION AND TREATMENT: Rehabilitation  ONSET DATE:  ~1 month  NEXT MD VISIT: 09/12/22   SUBJECTIVE:                                                                                                                                                                                                         SUBJECTIVE STATEMENT: Pt initially thought his pain was in his R hip - pain in R buttocks and down L LE with "wet" sensation in L lateral lower leg - PA told him pain more likely coming from his back.  Some relief from pain with meds prescribed by PA. Has altered gym workout - switching from TM or ellipitcal to recumbent bike.  PAIN: Are you having pain? No and Yes: NPRS scale: currently 0/10, up to 5-7/10 Pain location: R buttock, down posterior leg to calf; sometimes R low back Pain description: aching Aggravating factors: prolonged standing, esp on hard surface Relieving factors: stretching, Rx meds  PERTINENT HISTORY:  Arthritis; L hip OA s/p L THA 11/06/18; Chronic lymphoblastic leukemia; CLL (chronic lymphocytic leukemia  PRECAUTIONS: None  WEIGHT BEARING RESTRICTIONS: No  FALLS:  Has patient fallen in last 6 months? No  LIVING ENVIRONMENT: Lives with: lives with their spouse Lives in: House/apartment Stairs: No Has following equipment at home: None  OCCUPATION: Semi-retired  PLOF: Independent and Leisure: gym 4-5 days/wk - 30-45 min cardio with weights qod; hiking; active in church  PATIENT GOALS: "calm the pain down to a manageable level or make it go away"   OBJECTIVE: (objective measures completed at initial evaluation unless otherwise dated)  DIAGNOSTIC FINDINGS:  08/16/22 - XR Lumbar Spine:  Lumbar spine 2 views: Lower lumbar spine facet changes. Degenerative disc disease at L5-S1 and L3-4.  Endplate spurring throughout the lumbar spine.  No acute fractures.  No spondylolisthesis.   08/16/22 - XR Right hip w/ or w/o pelvis:  AP pelvis lateral view right hip: Bilateral hips are well located.  Status post left total hip  arthroplasty well-seated components.  No acute fractures of either hip.  Right hip joints well-maintained.    PATIENT SURVEYS:  Modified Oswestry 6 / 50 = 12.0 %  FOTO Lumbar spine = 76, predicted = 81  SCREENING FOR RED FLAGS: Bowel or bladder incontinence: No Spinal tumors: No Cauda equina syndrome: No Compression fracture: No Abdominal aneurysm: No  COGNITION:  Overall cognitive status: Within functional limits for tasks assessed    SENSATION: L LE radiculopathy - intermittent sensation of "wetness" in lateral R calf  MUSCLE LENGTH: Hamstrings: WNL ITB: mod tight B Piriformis: WFL Hip flexors: mod tight B Quads: mod tight B Heelcord: WFL but tighter on R  POSTURE:  No Significant postural limitations  PALPATION: Increased muscle tension in R>L lumbar paraspinals and glutes/piriformis  LUMBAR ROM:   Active  AROM  eval  Flexion WNL  Extension 50% limited  Right lateral flexion Hand to lateral knee  Left lateral flexion Hand to lateral knee  Right rotation WNL  Left rotation WNL  (Blank rows = not tested)  LOWER EXTREMITY ROM:    B LE ROM WFL other than limited hip IR bilaterally  LOWER EXTREMITY MMT:    MMT Right eval Left eval  Hip flexion 4+ 4+  Hip extension 4 4+  Hip abduction 4+ 4+  Hip adduction 4+ 4+  Hip internal rotation 5 5  Hip external rotation 4 4  Knee flexion 5 5  Knee extension 5 5  Ankle dorsiflexion 5 5  Ankle plantarflexion 5 5  Ankle inversion    Ankle eversion     (Blank rows = not tested)  LUMBAR SPECIAL TESTS:  Straight leg raise test: Positive and FABER test: Positive   TODAY'S TREATMENT:   08/30/22 Initial eval THERAPEUTIC EXERCISE: to improve flexibility, strength and mobility.  Verbal and tactile cues throughout for technique.  Supine R ITB stretch with strap x 30 sec Supine R mod thomas quad/hip flexor stretch with strap on Table x 30 sec Supine LTR 3 x 10" Supine posterior pelvic tilt 10 x 5"  PATIENT  EDUCATION:  Education details: PT eval findings, anticipated POC, and initial HEP Person educated: Patient Education method: Explanation, Demonstration, and [place] code texted to patient Education comprehension: verbalized understanding, returned demonstration, and needs further education  HOME EXERCISE PROGRAM: *Access Code: [email or web address] URL: [email or web address] Date: 08/30/2022 Prepared by: Annie Paras  Exercises - Supine Iliotibial Band Stretch with Strap  - 2-3 x daily - 7 x weekly - 3 reps - 30 sec hold - Supine Quadriceps Stretch with Strap on Table  - 2-3 x daily - 7 x weekly - 3 reps - 30 sec hold - Supine Lower Trunk Rotation  - 2-3 x daily - 7 x weekly - 5 reps - 10 sec hold - Supine Posterior Pelvic Tilt  - 2 x daily - 7 x weekly - 2 sets - 10 reps - 5 sec hold  *Patient using [place] app on phone  ASSESSMENT:  CLINICAL IMPRESSION: BYRLE GOBEN is a 62 y.o. male who was seen today for physical therapy evaluation and treatment for acute intermittent R-sided low back pain and R LE radiculopathy.  Patient reports onset of pain ~1 month ago without known MOI.  Originally thought pain was related to his hip until PA indicated that pain more likely coming from his back.  Pain is intermittent, typically aggravated by prolonged standing especially on hard surfaces and alleviated with stretching and positional changes.  Current deficits include intermittent pain and R LE radiculopathy, decreased proximal LE and lumbar flexibility, mildly limited lumbar ROM with tightness noted, and decreased core and proximal LE strength.  Benito will benefit from skilled PT to address above deficits to improve mobility and activity tolerance with decreased pain interference.   OBJECTIVE IMPAIRMENTS: decreased activity tolerance, decreased knowledge of condition, decreased mobility, difficulty walking, decreased ROM, decreased strength, increased fascial restrictions, impaired  perceived functional ability, increased muscle spasms, impaired flexibility, improper body mechanics, and pain.   ACTIVITY LIMITATIONS: carrying, lifting, standing, and locomotion level  PARTICIPATION LIMITATIONS: community activity and occupation  PERSONAL FACTORS: Past/current experiences, Time since onset of injury/illness/exacerbation, and 3+ comorbidities: Arthritis; L hip OA s/p L THA 11/06/18; Chronic lymphoblastic leukemia; CLL (chronic lymphocytic leukemia  are also affecting patient's functional outcome.   REHAB POTENTIAL: Excellent  CLINICAL DECISION MAKING: Stable/uncomplicated  EVALUATION COMPLEXITY: Low   GOALS: Goals reviewed with patient? Yes  SHORT TERM GOALS: Target date: 09/20/2022   Patient will be independent with initial HEP to improve outcomes and carryover.  Baseline: Initial HEP provided on eval Goal status: INITIAL  2.  Patient will report centralization of radicular symptoms.  Baseline: R LE radicular pain and "wet" sensation to lateral calf Goal status: INITIAL  LONG TERM GOALS: Target date: 10/11/2022   Patient will be independent with ongoing/advanced HEP for self-management at home.  Baseline:  Goal status: INITIAL  2.  Patient will report 50-75% improvement in low back pain & R LE radiculopathy to improve QOL.  Baseline: 0/10, up to 5-7/10 Goal status: INITIAL  3.  Patient to demonstrate ability to achieve and maintain good spinal alignment/posturing and body mechanics needed for daily activities. Baseline:  Goal status: INITIAL  4.  Patient will demonstrate functional pain free lumbar ROM to perform ADLs.   Baseline: refer to above lumbar ROM table Goal status: INITIAL  5.  Patient will demonstrate improved B proximal LE strength to 4+ to 5//5 for improved stability and ease of mobility . Baseline: refer to above MMT table Goal status: INITIAL  6.  Patient will  report 81 on lumbar FOTO to demonstrate improved functional ability.   Baseline: 76 Goal status: INITIAL   7. Patient will report </= 4% on Modified Oswestry to demonstrate improved functional ability with decreased pain interference. Baseline: 6 / 50 = 12.0 % Goal status: INITIAL  8.  Patient will tolerate >/= 30 min of standing w/o increased pain to allow for improved mobility and activity tolerance. Baseline: increased pain with prolonged standing Goal status: INITIAL   PLAN:  PT FREQUENCY: 2x/week  PT DURATION: 6 weeks  PLANNED INTERVENTIONS: Therapeutic exercises, Therapeutic activity, Neuromuscular re-education, Balance training, Gait training, Patient/Family education, Self Care, Joint mobilization, Aquatic Therapy, Dry Needling, Electrical stimulation, Spinal mobilization, Cryotherapy, Moist heat, Taping, Traction, Ultrasound, Ionotophoresis 4mg /ml Dexamethasone, Manual therapy, and Re-evaluation  PLAN FOR NEXT SESSION: Review initial HEP; posture and body mechanics education; progress lumbopelvic ROM/flexibility and strengthening; MT +/- DN as indicated  PT order: for his lumbar spine to work on range of motion, core strengthening, stretching, include modalities and home exercise program.   Percival Spanish, PT 08/30/2022, 1:36 PM

## 2022-08-30 ENCOUNTER — Ambulatory Visit: Payer: 59 | Attending: Physician Assistant | Admitting: Physical Therapy

## 2022-08-30 ENCOUNTER — Encounter: Payer: Self-pay | Admitting: Physical Therapy

## 2022-08-30 ENCOUNTER — Other Ambulatory Visit: Payer: Self-pay

## 2022-08-30 DIAGNOSIS — M5441 Lumbago with sciatica, right side: Secondary | ICD-10-CM | POA: Diagnosis not present

## 2022-08-30 DIAGNOSIS — M5416 Radiculopathy, lumbar region: Secondary | ICD-10-CM | POA: Insufficient documentation

## 2022-08-30 DIAGNOSIS — M6281 Muscle weakness (generalized): Secondary | ICD-10-CM | POA: Insufficient documentation

## 2022-08-30 DIAGNOSIS — M62830 Muscle spasm of back: Secondary | ICD-10-CM | POA: Insufficient documentation

## 2022-09-06 ENCOUNTER — Ambulatory Visit: Payer: 59 | Attending: Physician Assistant

## 2022-09-06 DIAGNOSIS — M5441 Lumbago with sciatica, right side: Secondary | ICD-10-CM | POA: Insufficient documentation

## 2022-09-06 DIAGNOSIS — M62830 Muscle spasm of back: Secondary | ICD-10-CM | POA: Diagnosis present

## 2022-09-06 DIAGNOSIS — M5416 Radiculopathy, lumbar region: Secondary | ICD-10-CM | POA: Diagnosis present

## 2022-09-06 DIAGNOSIS — M6281 Muscle weakness (generalized): Secondary | ICD-10-CM | POA: Insufficient documentation

## 2022-09-06 NOTE — Therapy (Addendum)
OUTPATIENT PHYSICAL THERAPY TREATMENT   Patient Name: Christopher Hill MRN: [id number] DOB:[birth year], 62 y.o., male Today's Date: 09/06/2022  END OF SESSION:  PT End of Session - 09/06/22 0856     Visit Number 2    Date for PT Re-Evaluation 10/11/22    Authorization Type UHC    PT Start Time 0850    PT Stop Time 0931    PT Time Calculation (min) 41 min    Activity Tolerance Patient tolerated treatment well    Behavior During Therapy WFL for tasks assessed/performed              Past Medical History:  Diagnosis Date   Allergic rhinitis, cause unspecified    Arthritis    Chronic lymphoblastic leukemia    CLL (chronic lymphocytic leukemia) 2014   History of kidney stones    Hyperlipidemia    Liver cyst    Other chronic allergic conjunctivitis    Past Surgical History:  Procedure Laterality Date   CATARACT EXTRACTION Bilateral    COLONOSCOPY     ESOPHAGOGASTRODUODENOSCOPY     LITHOTRIPSY  1997, 1995   TONSILLECTOMY  06/06/1962   TOTAL HIP ARTHROPLASTY Left 11/06/2018   Procedure: LEFT TOTAL HIP ARTHROPLASTY ANTERIOR APPROACH;  Surgeon: Mcarthur Rossetti, MD;  Location: [facility];  Service: Orthopedics;  Laterality: Left;   Patient Active Problem List   Diagnosis Date Noted   Status post total replacement of left hip 11/06/2018   Unilateral primary osteoarthritis, left hip 08/29/2018   Chronic lymphoblastic leukemia 01/21/2011   Allergic rhinitis due to pollen 08/16/2010   CONJUNCTIVITIS, ALLERGIC 01/23/2008    PCP: Deland Pretty, MD   REFERRING PROVIDER: Pete Pelt, PA-C   REFERRING DIAG: [phone] (ICD-10-CM) - Acute right-sided low back pain with right-sided sciatica   THERAPY DIAG:  Acute right-sided low back pain with right-sided sciatica  Radiculopathy, lumbar region  Muscle weakness (generalized)  Muscle spasm of back  RATIONALE FOR EVALUATION AND TREATMENT: Rehabilitation  ONSET DATE: ~1 month  NEXT MD VISIT: 09/12/22   SUBJECTIVE:                                                                                                                                                                                                          SUBJECTIVE STATEMENT: No pain radiating down the legs today, back is feeling stiff.  PAIN: Are you having pain? No and Yes: NPRS scale: currently 0/10, up to 5-7/10 Pain location: R buttock, down posterior leg to calf; sometimes R low back Pain description: aching Aggravating  factors: prolonged standing, esp on hard surface Relieving factors: stretching, Rx meds  PERTINENT HISTORY:  Arthritis; L hip OA s/p L THA 11/06/18; Chronic lymphoblastic leukemia; CLL (chronic lymphocytic leukemia  PRECAUTIONS: None  WEIGHT BEARING RESTRICTIONS: No  FALLS:  Has patient fallen in last 6 months? No  LIVING ENVIRONMENT: Lives with: lives with their spouse Lives in: House/apartment Stairs: No Has following equipment at home: None  OCCUPATION: Semi-retired  PLOF: Independent and Leisure: gym 4-5 days/wk - 30-45 min cardio with weights qod; hiking; active in church  PATIENT GOALS: "calm the pain down to a manageable level or make it go away"   OBJECTIVE: (objective measures completed at initial evaluation unless otherwise dated)  DIAGNOSTIC FINDINGS:  08/16/22 - XR Lumbar Spine:  Lumbar spine 2 views: Lower lumbar spine facet changes. Degenerative disc disease at L5-S1 and L3-4.  Endplate spurring throughout the lumbar spine.  No acute fractures.  No spondylolisthesis.   08/16/22 - XR Right hip w/ or w/o pelvis:  AP pelvis lateral view right hip: Bilateral hips are well located.  Status post left total hip arthroplasty well-seated components.  No acute fractures of either hip.  Right hip joints well-maintained.    PATIENT SURVEYS:  Modified Oswestry 6 / 50 = 12.0 %  FOTO Lumbar spine = 76, predicted = 81  SCREENING FOR RED FLAGS: Bowel or bladder incontinence: No Spinal tumors: No Cauda equina  syndrome: No Compression fracture: No Abdominal aneurysm: No  COGNITION:  Overall cognitive status: Within functional limits for tasks assessed    SENSATION: L LE radiculopathy - intermittent sensation of "wetness" in lateral R calf  MUSCLE LENGTH: Hamstrings: WNL ITB: mod tight B Piriformis: WFL Hip flexors: mod tight B Quads: mod tight B Heelcord: WFL but tighter on R  POSTURE:  No Significant postural limitations  PALPATION: Increased muscle tension in R>L lumbar paraspinals and glutes/piriformis  LUMBAR ROM:   Active  AROM  eval  Flexion WNL  Extension 50% limited  Right lateral flexion Hand to lateral knee  Left lateral flexion Hand to lateral knee  Right rotation WNL  Left rotation WNL  (Blank rows = not tested)  LOWER EXTREMITY ROM:    B LE ROM WFL other than limited hip IR bilaterally  LOWER EXTREMITY MMT:    MMT Right eval Left eval  Hip flexion 4+ 4+  Hip extension 4 4+  Hip abduction 4+ 4+  Hip adduction 4+ 4+  Hip internal rotation 5 5  Hip external rotation 4 4  Knee flexion 5 5  Knee extension 5 5  Ankle dorsiflexion 5 5  Ankle plantarflexion 5 5  Ankle inversion    Ankle eversion     (Blank rows = not tested)  LUMBAR SPECIAL TESTS:  Straight leg raise test: Positive and FABER test: Positive   TODAY'S TREATMENT:  09/06/22 THERAPEUTIC EXERCISE: to improve flexibility, strength and mobility.  Verbal and tactile cues throughout for technique.  Recumbent Bike L4x53min Supine ITB stretch w/ strap x 30 sec Supine pelvic tilts 10x5" LTR 3x10" bil Supine core bicycles 10x bil Supine isometric dead bug with orange pball 4x10" Pallof press black TB standing x 10 bil  Review of posture and body mechanics   08/30/22 Initial eval THERAPEUTIC EXERCISE: to improve flexibility, strength and mobility.  Verbal and tactile cues throughout for technique.  Supine R ITB stretch with strap x 30 sec Supine R mod thomas quad/hip flexor stretch with  strap on Table x 30 sec Supine LTR 3  x 10" Supine posterior pelvic tilt 10 x 5"   PATIENT EDUCATION:  Education details: HEP update Person educated: Patient Education method: Explanation, Demonstration, and [place] code texted to patient Education comprehension: verbalized understanding, returned demonstration, and needs further education  HOME EXERCISE PROGRAM: *Access Code: [email or web address] URL: [email or web address] Date: 09/06/2022 Prepared by: Clarene Essex  Exercises - Supine Iliotibial Band Stretch with Strap  - 2-3 x daily - 7 x weekly - 3 reps - 30 sec hold - Supine Quadriceps Stretch with Strap on Table  - 2-3 x daily - 7 x weekly - 3 reps - 30 sec hold - Supine Lower Trunk Rotation  - 2-3 x daily - 7 x weekly - 5 reps - 10 sec hold - Supine Posterior Pelvic Tilt  - 2 x daily - 7 x weekly - 2 sets - 10 reps - 5 sec hold - Supine Bicycles  - 1 x daily - 7 x weekly - 3 sets - 10 reps - Isometric Dead Bug  - 1 x daily - 7 x weekly - 3 sets - 10 reps  Patient Education - Biomedical scientist  *Patient using [place] app on phone  ASSESSMENT:  CLINICAL IMPRESSION: Sonia demonstrated a good response to initial treatment. Reviewed his HEP to ensure understanding and also progressed to include more core stabilization exercises. Reviewed posture and body mechanics handout with him as well. Throughout session cues were needed to avoid flexing his neck with the pelvic tilts and core stabilization exercises.  OBJECTIVE IMPAIRMENTS: decreased activity tolerance, decreased knowledge of condition, decreased mobility, difficulty walking, decreased ROM, decreased strength, increased fascial restrictions, impaired perceived functional ability, increased muscle spasms, impaired flexibility, improper body mechanics, and pain.   ACTIVITY LIMITATIONS: carrying, lifting, standing, and locomotion level  PARTICIPATION LIMITATIONS: community activity and  occupation  PERSONAL FACTORS: Past/current experiences, Time since onset of injury/illness/exacerbation, and 3+ comorbidities: Arthritis; L hip OA s/p L THA 11/06/18; Chronic lymphoblastic leukemia; CLL (chronic lymphocytic leukemia  are also affecting patient's functional outcome.   REHAB POTENTIAL: Excellent  CLINICAL DECISION MAKING: Stable/uncomplicated  EVALUATION COMPLEXITY: Low   GOALS: Goals reviewed with patient? Yes  SHORT TERM GOALS: Target date: 09/20/2022   Patient will be independent with initial HEP to improve outcomes and carryover.  Baseline: Initial HEP provided on eval Goal status: IN PROGRESS  2.  Patient will report centralization of radicular symptoms.  Baseline: R LE radicular pain and "wet" sensation to lateral calf Goal status: IN PROGRESS  LONG TERM GOALS: Target date: 10/11/2022   Patient will be independent with ongoing/advanced HEP for self-management at home.  Baseline:  Goal status: IN PROGRESS  2.  Patient will report 50-75% improvement in low back pain & R LE radiculopathy to improve QOL.  Baseline: 0/10, up to 5-7/10 Goal status: IN PROGRESS  3.  Patient to demonstrate ability to achieve and maintain good spinal alignment/posturing and body mechanics needed for daily activities. Baseline:  Goal status: IN PROGRESS  4.  Patient will demonstrate functional pain free lumbar ROM to perform ADLs.   Baseline: refer to above lumbar ROM table Goal status: IN PROGRESS  5.  Patient will demonstrate improved B proximal LE strength to 4+ to 5//5 for improved stability and ease of mobility . Baseline: refer to above MMT table Goal status: IN PROGRESS  6.  Patient will report 2 on lumbar FOTO to demonstrate improved functional ability.  Baseline: 76 Goal status: IN PROGRESS   7. Patient will report </= 4% on  Modified Oswestry to demonstrate improved functional ability with decreased pain interference. Baseline: 6 / 50 = 12.0 % Goal status: IN  PROGRESS  8.  Patient will tolerate >/= 30 min of standing w/o increased pain to allow for improved mobility and activity tolerance. Baseline: increased pain with prolonged standing Goal status: IN PROGRESS   PLAN:  PT FREQUENCY: 2x/week  PT DURATION: 6 weeks  PLANNED INTERVENTIONS: Therapeutic exercises, Therapeutic activity, Neuromuscular re-education, Balance training, Gait training, Patient/Family education, Self Care, Joint mobilization, Aquatic Therapy, Dry Needling, Electrical stimulation, Spinal mobilization, Cryotherapy, Moist heat, Taping, Traction, Ultrasound, Ionotophoresis 4mg /ml Dexamethasone, Manual therapy, and Re-evaluation  PLAN FOR NEXT SESSION: progress lumbopelvic ROM/flexibility and strengthening; MT +/- DN as indicated  PT order: for his lumbar spine to work on range of motion, core strengthening, stretching, include modalities and home exercise program.   Artist Pais, PTA 09/06/2022, 9:46 AM

## 2022-09-09 ENCOUNTER — Encounter: Payer: Self-pay | Admitting: Physical Therapy

## 2022-09-09 ENCOUNTER — Ambulatory Visit: Payer: 59 | Admitting: Physical Therapy

## 2022-09-09 DIAGNOSIS — M5441 Lumbago with sciatica, right side: Secondary | ICD-10-CM

## 2022-09-09 DIAGNOSIS — M62830 Muscle spasm of back: Secondary | ICD-10-CM

## 2022-09-09 DIAGNOSIS — M6281 Muscle weakness (generalized): Secondary | ICD-10-CM

## 2022-09-09 DIAGNOSIS — M5416 Radiculopathy, lumbar region: Secondary | ICD-10-CM

## 2022-09-09 NOTE — Therapy (Signed)
OUTPATIENT PHYSICAL THERAPY TREATMENT   Patient Name: Christopher Hill MRN: [id number] DOB:[birth year], 62 y.o., male Today's Date: 09/09/2022  END OF SESSION:  PT End of Session - 09/09/22 0804     Visit Number 3    Date for PT Re-Evaluation 10/11/22    Authorization Type U[employer]    PT Start Time 0804    PT Stop Time 0847    PT Time Calculation (min) 43 min    Activity Tolerance Patient tolerated treatment well    Behavior During Therapy WFL for tasks assessed/performed              Past Medical History:  Diagnosis Date   Allergic rhinitis, cause unspecified    Arthritis    Chronic lymphoblastic leukemia    CLL (chronic lymphocytic leukemia) 2014   History of kidney stones    Hyperlipidemia    Liver cyst    Other chronic allergic conjunctivitis    Past Surgical History:  Procedure Laterality Date   CATARACT EXTRACTION Bilateral    COLONOSCOPY     ESOPHAGOGASTRODUODENOSCOPY     LITHOTRIPSY  1997, 1995   TONSILLECTOMY  06/06/1962   TOTAL HIP ARTHROPLASTY Left 11/06/2018   Procedure: LEFT TOTAL HIP ARTHROPLASTY ANTERIOR APPROACH;  Surgeon: Kathryne Hitch, MD;  Location: [facility] OR;  Service: Orthopedics;  Laterality: Left;   Patient Active Problem List   Diagnosis Date Noted   Status post total replacement of left hip 11/06/2018   Unilateral primary osteoarthritis, left hip 08/29/2018   Chronic lymphoblastic leukemia 01/21/2011   Allergic rhinitis due to pollen 08/16/2010   CONJUNCTIVITIS, ALLERGIC 01/23/2008    PCP: Merri Brunette, MD   REFERRING PROVIDER: Kirtland Bouchard, PA-C   REFERRING DIAG: [phone] (ICD-10-CM) - Acute right-sided low back pain with right-sided sciatica   THERAPY DIAG:  Acute right-sided low back pain with right-sided sciatica  Radiculopathy, lumbar region  Muscle weakness (generalized)  Muscle spasm of back  RATIONALE FOR EVALUATION AND TREATMENT: Rehabilitation  ONSET DATE: ~1 month  NEXT MD VISIT: 09/12/22   SUBJECTIVE:                                                                                                                                                                                                          SUBJECTIVE STATEMENT: Pt reports his balance feels off today as his ears are full from his allergies. Pt reports very little pain for the last week, usually only at night when he does feel pain. No pain in leg with back feeling tight or achy when  bothering him.  PAIN: Are you having pain? No  PERTINENT HISTORY:  Arthritis; L hip OA s/p L THA 11/06/18; Chronic lymphoblastic leukemia; CLL (chronic lymphocytic leukemia  PRECAUTIONS: None  WEIGHT BEARING RESTRICTIONS: No  FALLS:  Has patient fallen in last 6 months? No  LIVING ENVIRONMENT: Lives with: lives with their spouse Lives in: House/apartment Stairs: No Has following equipment at home: None  OCCUPATION: Semi-retired  PLOF: Independent and Leisure: gym 4-5 days/wk - 30-45 min cardio with weights qod; hiking; active in church  PATIENT GOALS: "calm the pain down to a manageable level or make it go away"   OBJECTIVE: (objective measures completed at initial evaluation unless otherwise dated)  DIAGNOSTIC FINDINGS:  08/16/22 - XR Lumbar Spine:  Lumbar spine 2 views: Lower lumbar spine facet changes. Degenerative disc disease at L5-S1 and L3-4.  Endplate spurring throughout the lumbar spine.  No acute fractures.  No spondylolisthesis.   08/16/22 - XR Right hip w/ or w/o pelvis:  AP pelvis lateral view right hip: Bilateral hips are well located.  Status post left total hip arthroplasty well-seated components.  No acute fractures of either hip.  Right hip joints well-maintained.    PATIENT SURVEYS:  Modified Oswestry 6 / 50 = 12.0 %  FOTO Lumbar spine = 76, predicted = 81  SCREENING FOR RED FLAGS: Bowel or bladder incontinence: No Spinal tumors: No Cauda equina syndrome: No Compression fracture: No Abdominal aneurysm:  No  COGNITION:  Overall cognitive status: Within functional limits for tasks assessed    SENSATION: L LE radiculopathy - intermittent sensation of "wetness" in lateral R calf  MUSCLE LENGTH: Hamstrings: WNL ITB: mod tight B Piriformis: WFL Hip flexors: mod tight B Quads: mod tight B Heelcord: WFL but tighter on R  POSTURE:  No Significant postural limitations  PALPATION: Increased muscle tension in R>L lumbar paraspinals and glutes/piriformis  LUMBAR ROM:   Active  AROM  eval  Flexion WNL  Extension 50% limited  Right lateral flexion Hand to lateral knee  Left lateral flexion Hand to lateral knee  Right rotation WNL  Left rotation WNL  (Blank rows = not tested)  LOWER EXTREMITY ROM:    B LE ROM WFL other than limited hip IR bilaterally  LOWER EXTREMITY MMT:    MMT Right eval Left eval  Hip flexion 4+ 4+  Hip extension 4 4+  Hip abduction 4+ 4+  Hip adduction 4+ 4+  Hip internal rotation 5 5  Hip external rotation 4 4  Knee flexion 5 5  Knee extension 5 5  Ankle dorsiflexion 5 5  Ankle plantarflexion 5 5  Ankle inversion    Ankle eversion     (Blank rows = not tested)  LUMBAR SPECIAL TESTS:  Straight leg raise test: Positive and FABER test: Positive   TODAY'S TREATMENT:   09/09/22 THERAPEUTIC EXERCISE: to improve flexibility, strength and mobility.  Verbal and tactile cues throughout for technique.  TM 1.5-2.0 mph x 6 min Supine core bicycles with looped GTB at forefeet 3 x 10 Bridge + GTB hip ABD 3 x 10 Quadruped GTB fire hydrants 2 x 10 each, cues to avoid twisting back Quadruped alt bird dog 2 x 10, cues to maintain level back Seated 3-way prayer stretch with green Pball 3 x 30" each position BATCA cable pallof press 5# x 10 bil  MANUAL THERAPY: To promote normalized muscle tension, improved flexibility, and reduced pain. STM/DTM and manual TPR to L piriformis  Provided demonstration of self-STM to glutes/piriformis  and lumbar paraspinals  using tennis ball on wall and/or foam roller   09/06/22 THERAPEUTIC EXERCISE: to improve flexibility, strength and mobility.  Verbal and tactile cues throughout for technique.  Recumbent Bike L4x117min Supine ITB stretch w/ strap x 30 sec Supine pelvic tilts 10x5" LTR 3x10" bil Supine core bicycles 10x bil Supine isometric dead bug with orange pball 4x10" Pallof press black TB standing x 10 bil  Review of posture and body mechanics   08/30/22 Initial eval THERAPEUTIC EXERCISE: to improve flexibility, strength and mobility.  Verbal and tactile cues throughout for technique.  Supine R ITB stretch with strap x 30 sec Supine R mod thomas quad/hip flexor stretch with strap on Table x 30 sec Supine LTR 3 x 10" Supine posterior pelvic tilt 10 x 5"   PATIENT EDUCATION:  Education details: HEP update Person educated: Patient Education method: Explanation, Demonstration, and MedBridgeGO code texted to patient Education comprehension: verbalized understanding, returned demonstration, and needs further education  HOME EXERCISE PROGRAM: *Access Code: [email or web address] URL: [email or web address] Date: 09/06/2022 Prepared by: Verta EllenBraylin Clark  Exercises - Supine Iliotibial Band Stretch with Strap  - 2-3 x daily - 7 x weekly - 3 reps - 30 sec hold - Supine Quadriceps Stretch with Strap on Table  - 2-3 x daily - 7 x weekly - 3 reps - 30 sec hold - Supine Lower Trunk Rotation  - 2-3 x daily - 7 x weekly - 5 reps - 10 sec hold - Supine Posterior Pelvic Tilt  - 2 x daily - 7 x weekly - 2 sets - 10 reps - 5 sec hold - Supine Bicycles  - 1 x daily - 7 x weekly - 3 sets - 10 reps - Isometric Dead Bug  - 1 x daily - 7 x weekly - 3 sets - 10 reps  Patient Education - Hospital doctorosture and Body Mechanics  *Patient using MedBridgeGO app on phone  ASSESSMENT:  CLINICAL IMPRESSION: Jillyn HiddenGary reports good reduction in his overall pain, with centralization of the pain to his back (STG #2 met) and less frequent,  typically only at night.  He reports HEP going well (STG #1 met) and denies need for review of exercises or education on posture and body mechanics. Continued to progress current HEP exercises with addition of GTB resistance to supine bicycles, as well as progressing overall core/lumbopelvic strengthening while targeting areas of weakness in hip extensors and ER identified on eval. He continues to note tightness in low back as well as "pinching" in L piriformis during fire hydrants, therefore reviewed relevant stretches and self-STM techniques using a tennis ball on the wall or a foam roller. Jillyn HiddenGary will continue to benefit from skilled PT to address ongoing flexibility and strength deficits to improve mobility and activity tolerance with decreased pain interference.   OBJECTIVE IMPAIRMENTS: decreased activity tolerance, decreased knowledge of condition, decreased mobility, difficulty walking, decreased ROM, decreased strength, increased fascial restrictions, impaired perceived functional ability, increased muscle spasms, impaired flexibility, improper body mechanics, and pain.   ACTIVITY LIMITATIONS: carrying, lifting, standing, and locomotion level  PARTICIPATION LIMITATIONS: community activity and occupation  PERSONAL FACTORS: Past/current experiences, Time since onset of injury/illness/exacerbation, and 3+ comorbidities: Arthritis; L hip OA s/p L THA 11/06/18; Chronic lymphoblastic leukemia; CLL (chronic lymphocytic leukemia  are also affecting patient's functional outcome.   REHAB POTENTIAL: Excellent  CLINICAL DECISION MAKING: Stable/uncomplicated  EVALUATION COMPLEXITY: Low   GOALS: Goals reviewed with patient? Yes  SHORT TERM GOALS: Target date: 09/20/2022   Patient will be  independent with initial HEP to improve outcomes and carryover.  Baseline: Initial HEP provided on eval Goal status: MET  09/09/22  2.  Patient will report centralization of radicular symptoms.  Baseline: R LE  radicular pain and "wet" sensation to lateral calf Goal status: MET  09/09/22  LONG TERM GOALS: Target date: 10/11/2022   Patient will be independent with ongoing/advanced HEP for self-management at home.  Baseline:  Goal status: IN PROGRESS  2.  Patient will report 50-75% improvement in low back pain & R LE radiculopathy to improve QOL.  Baseline: 0/10, up to 5-7/10 Goal status: IN PROGRESS  3.  Patient to demonstrate ability to achieve and maintain good spinal alignment/posturing and body mechanics needed for daily activities. Baseline:  Goal status: IN PROGRESS  4.  Patient will demonstrate functional pain free lumbar ROM to perform ADLs.   Baseline: refer to above lumbar ROM table Goal status: IN PROGRESS  5.  Patient will demonstrate improved B proximal LE strength to 4+ to 5//5 for improved stability and ease of mobility . Baseline: refer to above MMT table Goal status: IN PROGRESS  6.  Patient will report 104 on lumbar FOTO to demonstrate improved functional ability.  Baseline: 76 Goal status: IN PROGRESS   7. Patient will report </= 4% on Modified Oswestry to demonstrate improved functional ability with decreased pain interference. Baseline: 6 / 50 = 12.0 % Goal status: IN PROGRESS  8.  Patient will tolerate >/= 30 min of standing w/o increased pain to allow for improved mobility and activity tolerance. Baseline: increased pain with prolonged standing Goal status: IN PROGRESS   PLAN:  PT FREQUENCY: 2x/week  PT DURATION: 6 weeks  PLANNED INTERVENTIONS: Therapeutic exercises, Therapeutic activity, Neuromuscular re-education, Balance training, Gait training, Patient/Family education, Self Care, Joint mobilization, Aquatic Therapy, Dry Needling, Electrical stimulation, Spinal mobilization, Cryotherapy, Moist heat, Taping, Traction, Ultrasound, Ionotophoresis 4mg /ml Dexamethasone, Manual therapy, and Re-evaluation  PLAN FOR NEXT SESSION: progress lumbopelvic  ROM/flexibility and strengthening; MT +/- DN as indicated  PT order: for his lumbar spine to work on range of motion, core strengthening, stretching, include modalities and home exercise program.   Marry Guan, PT 09/09/2022, 11:23 AM

## 2022-09-12 ENCOUNTER — Ambulatory Visit: Payer: 59 | Admitting: Physician Assistant

## 2022-09-14 ENCOUNTER — Encounter: Payer: Self-pay | Admitting: Physical Therapy

## 2022-09-14 ENCOUNTER — Ambulatory Visit: Payer: 59 | Admitting: Physical Therapy

## 2022-09-14 DIAGNOSIS — M5416 Radiculopathy, lumbar region: Secondary | ICD-10-CM

## 2022-09-14 DIAGNOSIS — M6281 Muscle weakness (generalized): Secondary | ICD-10-CM

## 2022-09-14 DIAGNOSIS — M62830 Muscle spasm of back: Secondary | ICD-10-CM

## 2022-09-14 DIAGNOSIS — M5441 Lumbago with sciatica, right side: Secondary | ICD-10-CM

## 2022-09-14 NOTE — Therapy (Signed)
OUTPATIENT PHYSICAL THERAPY TREATMENT   Patient Name: Christopher Hill MRN: [id number] DOB:01-23-1961, 62 y.o., male Today's Date: 09/14/2022  END OF SESSION:  PT End of Session - 09/14/22 0930     Visit Number 4    Date for PT Re-Evaluation 10/11/22    Authorization Type UHC    PT Start Time 0930    PT Stop Time 1016    PT Time Calculation (min) 46 min    Activity Tolerance Patient tolerated treatment well    Behavior During Therapy WFL for tasks assessed/performed               Past Medical History:  Diagnosis Date   Allergic rhinitis, cause unspecified    Arthritis    Chronic lymphoblastic leukemia    CLL (chronic lymphocytic leukemia) 2014   History of kidney stones    Hyperlipidemia    Liver cyst    Other chronic allergic conjunctivitis    Past Surgical History:  Procedure Laterality Date   CATARACT EXTRACTION Bilateral    COLONOSCOPY     ESOPHAGOGASTRODUODENOSCOPY     LITHOTRIPSY  1997, 1995   TONSILLECTOMY  06/06/1962   TOTAL HIP ARTHROPLASTY Left 11/06/2018   Procedure: LEFT TOTAL HIP ARTHROPLASTY ANTERIOR APPROACH;  Surgeon: Kathryne Hitch, MD;  Location: [facility] OR;  Service: Orthopedics;  Laterality: Left;   Patient Active Problem List   Diagnosis Date Noted   Status post total replacement of left hip 11/06/2018   Unilateral primary osteoarthritis, left hip 08/29/2018   Chronic lymphoblastic leukemia 01/21/2011   Allergic rhinitis due to pollen 08/16/2010   CONJUNCTIVITIS, ALLERGIC 01/23/2008    PCP: Merri Brunette, MD   REFERRING PROVIDER: Kirtland Bouchard, PA-C   REFERRING DIAG: [phone] (ICD-10-CM) - Acute right-sided low back pain with right-sided sciatica   THERAPY DIAG:  Acute right-sided low back pain with right-sided sciatica  Radiculopathy, lumbar region  Muscle weakness (generalized)  Muscle spasm of back  RATIONALE FOR EVALUATION AND TREATMENT: Rehabilitation  ONSET DATE: ~1 month  NEXT MD VISIT: 09/12/22   SUBJECTIVE:                                                                                                                                                                                                          SUBJECTIVE STATEMENT: Pt reports some increased pain in his R buttock and leg after overdoing things at the gym the other day - pain resolved with Tylenol. He notes the real test will start tomorrow as [employer] starts - he will be working 8-6.  PAIN: Are you having pain? No  PERTINENT HISTORY:  Arthritis; L hip OA s/p L THA 11/06/18; Chronic lymphoblastic leukemia; CLL (chronic lymphocytic leukemia  PRECAUTIONS: None  WEIGHT BEARING RESTRICTIONS: No  FALLS:  Has patient fallen in last 6 months? No  LIVING ENVIRONMENT: Lives with: lives with their spouse Lives in: House/apartment Stairs: No Has following equipment at home: None  OCCUPATION: Semi-retired  PLOF: Independent and Leisure: gym 4-5 days/wk - 30-45 min cardio with weights qod; hiking; active in church  PATIENT GOALS: "calm the pain down to a manageable level or make it go away"   OBJECTIVE: (objective measures completed at initial evaluation unless otherwise dated)  DIAGNOSTIC FINDINGS:  08/16/22 - XR Lumbar Spine:  Lumbar spine 2 views: Lower lumbar spine facet changes. Degenerative disc disease at L5-S1 and L3-4.  Endplate spurring throughout the lumbar spine.  No acute fractures.  No spondylolisthesis.   08/16/22 - XR Right hip w/ or w/o pelvis:  AP pelvis lateral view right hip: Bilateral hips are well located.  Status post left total hip arthroplasty well-seated components.  No acute fractures of either hip.  Right hip joints well-maintained.    PATIENT SURVEYS:  Modified Oswestry 6 / 50 = 12.0 %  FOTO Lumbar spine = 76, predicted = 81  SCREENING FOR RED FLAGS: Bowel or bladder incontinence: No Spinal tumors: No Cauda equina syndrome: No Compression fracture: No Abdominal aneurysm:  No  COGNITION:  Overall cognitive status: Within functional limits for tasks assessed    SENSATION: L LE radiculopathy - intermittent sensation of "wetness" in lateral R calf  MUSCLE LENGTH: Hamstrings: WNL ITB: mod tight B Piriformis: WFL Hip flexors: mod tight B Quads: mod tight B Heelcord: WFL but tighter on R  POSTURE:  No Significant postural limitations  PALPATION: Increased muscle tension in R>L lumbar paraspinals and glutes/piriformis  LUMBAR ROM:   Active  AROM  eval  Flexion WNL  Extension 50% limited  Right lateral flexion Hand to lateral knee  Left lateral flexion Hand to lateral knee  Right rotation WNL  Left rotation WNL  (Blank rows = not tested)  LOWER EXTREMITY ROM:    B LE ROM WFL other than limited hip IR bilaterally  LOWER EXTREMITY MMT:    MMT Right eval Left eval  Hip flexion 4+ 4+  Hip extension 4 4+  Hip abduction 4+ 4+  Hip adduction 4+ 4+  Hip internal rotation 5 5  Hip external rotation 4 4  Knee flexion 5 5  Knee extension 5 5  Ankle dorsiflexion 5 5  Ankle plantarflexion 5 5  Ankle inversion    Ankle eversion     (Blank rows = not tested)  LUMBAR SPECIAL TESTS:  Straight leg raise test: Positive and FABER test: Positive   TODAY'S TREATMENT:   09/14/22 THERAPEUTIC EXERCISE: to improve flexibility, strength and mobility.  Verbal and tactile cues throughout for technique.  Elliptical - L2.0 x 6 min Functional squat with B shoulder flexion holding 5# db bil 3 x 20"  Functional squat with B shoulder flexion/extension holding 5# db bil 3 x 5 reps of arm motion Full plank 3 x 20-30" hold Full plank + alt hip extension 3 x 5 BATCA cable pallof press 5# + short-arc trunk rotation x 10 bil Standing GTB fire hydrant x 10 B side stepping with GTB at ankles 2 x 25 ft Fwd/back GTB monster walk 2 x 25 ft Staggered stance blue TB rows 2 x 10, 1 set with  each foot fwd - cues for TrA isometric Staggered stance blue TB scap retraction  + B shoulder extension 2 x 10, 1 set with each foot fwd - cues for TrA isometric   09/09/22 THERAPEUTIC EXERCISE: to improve flexibility, strength and mobility.  Verbal and tactile cues throughout for technique.  TM 1.5-2.0 mph x 6 min Supine core bicycles with looped GTB at forefeet 3 x 10 Bridge + GTB hip ABD 3 x 10 Quadruped GTB fire hydrants 2 x 10 each, cues to avoid twisting back Quadruped alt bird dog 2 x 10, cues to maintain level back Seated 3-way prayer stretch with green Pball 3 x 30" each position BATCA cable pallof press 5# x 10 bil  MANUAL THERAPY: To promote normalized muscle tension, improved flexibility, and reduced pain. STM/DTM and manual TPR to L piriformis  Provided demonstration of self-STM to glutes/piriformis and lumbar paraspinals using tennis ball on wall and/or foam roller   09/06/22 THERAPEUTIC EXERCISE: to improve flexibility, strength and mobility.  Verbal and tactile cues throughout for technique.  Recumbent Bike L4x49min Supine ITB stretch w/ strap x 30 sec Supine pelvic tilts 10x5" LTR 3x10" bil Supine core bicycles 10x bil Supine isometric dead bug with orange pball 4x10" Pallof press black TB standing x 10 bil  Review of posture and body mechanics   PATIENT EDUCATION:  Education details: HEP update Person educated: Patient Education method: Explanation, Demonstration, and MedBridgeGO code texted to patient Education comprehension: verbalized understanding, returned demonstration, and needs further education  HOME EXERCISE PROGRAM: *Access Code: [email or web address] URL: [email or web address] Date: 09/14/2022 Prepared by: Glenetta Hew  Exercises - Supine Iliotibial Band Stretch with Strap  - 2-3 x daily - 7 x weekly - 3 reps - 30 sec hold - Supine Quadriceps Stretch with Strap on Table  - 2-3 x daily - 7 x weekly - 3 reps - 30 sec hold - Supine Lower Trunk Rotation  - 2-3 x daily - 7 x weekly - 5 reps - 10 sec hold - Supine Posterior  Pelvic Tilt  - 2 x daily - 7 x weekly - 2 sets - 10 reps - 5 sec hold - Supine Bicycles  - 1 x daily - 7 x weekly - 3 sets - 10 reps - Isometric Dead Bug  - 1 x daily - 7 x weekly - 3 sets - 10 reps - Quadruped Hip Abduction with Resistance Loop  - 1 x daily - 7 x weekly - 2 sets - 10 reps - 3 sec hold  Patient Education - Hospital doctor  *Patient using MedBridgeGO app on phone  ASSESSMENT:  CLINICAL IMPRESSION: Levii reports a flare-up after overdoing things at the gym earlier this week which resolved with Tylenol. He will be on his feet for 10-hr days during [employer] starting tomorrow through next Wednesday, therefore focused on core activation/stabilization with proximal UE/LE strengthening during standing and antigravity exercises/activities. All exercises well tolerated w/o increased pain other than slight cramping in R posterior hip/buttocks with standing fire hydrants. He he was able to relieve this with stretching and denied need for any MT today.  OBJECTIVE IMPAIRMENTS: decreased activity tolerance, decreased knowledge of condition, decreased mobility, difficulty walking, decreased ROM, decreased strength, increased fascial restrictions, impaired perceived functional ability, increased muscle spasms, impaired flexibility, improper body mechanics, and pain.   ACTIVITY LIMITATIONS: carrying, lifting, standing, and locomotion level  PARTICIPATION LIMITATIONS: community activity and occupation  PERSONAL FACTORS: Past/current experiences, Time since onset of injury/illness/exacerbation, and 3+ comorbidities:  Arthritis; L hip OA s/p L THA 11/06/18; Chronic lymphoblastic leukemia; CLL (chronic lymphocytic leukemia  are also affecting patient's functional outcome.   REHAB POTENTIAL: Excellent  CLINICAL DECISION MAKING: Stable/uncomplicated  EVALUATION COMPLEXITY: Low   GOALS: Goals reviewed with patient? Yes  SHORT TERM GOALS: Target date: 09/20/2022   Patient will  be independent with initial HEP to improve outcomes and carryover.  Baseline: Initial HEP provided on eval Goal status: MET  09/09/22  2.  Patient will report centralization of radicular symptoms.  Baseline: R LE radicular pain and "wet" sensation to lateral calf Goal status: MET  09/09/22  LONG TERM GOALS: Target date: 10/11/2022   Patient will be independent with ongoing/advanced HEP for self-management at home.  Baseline:  Goal status: IN PROGRESS  2.  Patient will report 50-75% improvement in low back pain & R LE radiculopathy to improve QOL.  Baseline: 0/10, up to 5-7/10 Goal status: IN PROGRESS  3.  Patient to demonstrate ability to achieve and maintain good spinal alignment/posturing and body mechanics needed for daily activities. Baseline:  Goal status: IN PROGRESS  4.  Patient will demonstrate functional pain free lumbar ROM to perform ADLs.   Baseline: refer to above lumbar ROM table Goal status: IN PROGRESS  5.  Patient will demonstrate improved B proximal LE strength to 4+ to 5//5 for improved stability and ease of mobility . Baseline: refer to above MMT table Goal status: IN PROGRESS  6.  Patient will report [id number] on lumbar FOTO to demonstrate improved functional ability.  Baseline: 76 Goal status: IN PROGRESS   7. Patient will report </= 4% on Modified Oswestry to demonstrate improved functional ability with decreased pain interference. Baseline: 6 / 50 = 12.0 % Goal status: IN PROGRESS  8.  Patient will tolerate >/= 30 min of standing w/o increased pain to allow for improved mobility and activity tolerance. Baseline: increased pain with prolonged standing Goal status: IN PROGRESS   PLAN:  PT FREQUENCY: 2x/week  PT DURATION: 6 weeks  PLANNED INTERVENTIONS: Therapeutic exercises, Therapeutic activity, Neuromuscular re-education, Balance training, Gait training, Patient/Family education, Self Care, Joint mobilization, Aquatic Therapy, Dry Needling, Electrical  stimulation, Spinal mobilization, Cryotherapy, Moist heat, Taping, Traction, Ultrasound, Ionotophoresis 4mg /ml Dexamethasone, Manual therapy, and Re-evaluation  PLAN FOR NEXT SESSION: progress lumbopelvic ROM/flexibility and strengthening; MT +/- DN as indicated  PT order: for his lumbar spine to work on range of motion, core strengthening, stretching, include modalities and home exercise program.   Marry GuanJoAnne M Samone Guhl, PT 09/14/2022, 1:38 PM

## 2022-09-16 ENCOUNTER — Encounter: Payer: 59 | Admitting: Physical Therapy

## 2022-09-20 ENCOUNTER — Ambulatory Visit: Payer: 59 | Admitting: Physical Therapy

## 2022-09-20 ENCOUNTER — Encounter: Payer: Self-pay | Admitting: Physical Therapy

## 2022-09-20 DIAGNOSIS — M62830 Muscle spasm of back: Secondary | ICD-10-CM

## 2022-09-20 DIAGNOSIS — M5441 Lumbago with sciatica, right side: Secondary | ICD-10-CM

## 2022-09-20 DIAGNOSIS — M6281 Muscle weakness (generalized): Secondary | ICD-10-CM

## 2022-09-20 DIAGNOSIS — M5416 Radiculopathy, lumbar region: Secondary | ICD-10-CM

## 2022-09-20 NOTE — Therapy (Signed)
OUTPATIENT PHYSICAL THERAPY TREATMENT   Patient Name: Christopher Hill MRN: [id number] DOB:1961-05-04, 62 y.o., male Today's Date: 09/20/2022  END OF SESSION:  PT End of Session - 09/20/22 0846     Visit Number 5    Date for PT Re-Evaluation 10/11/22    Authorization Type [employer]    PT Start Time [phone]    PT Stop Time 0930    PT Time Calculation (min) 44 min    Activity Tolerance Patient tolerated treatment well    Behavior During Therapy WFL for tasks assessed/performed               Past Medical History:  Diagnosis Date   Allergic rhinitis, cause unspecified    Arthritis    Chronic lymphoblastic leukemia    CLL (chronic lymphocytic leukemia) 2014   History of kidney stones    Hyperlipidemia    Liver cyst    Other chronic allergic conjunctivitis    Past Surgical History:  Procedure Laterality Date   CATARACT EXTRACTION Bilateral    COLONOSCOPY     ESOPHAGOGASTRODUODENOSCOPY     LITHOTRIPSY  1997, 1995   TONSILLECTOMY  06/06/1962   TOTAL HIP ARTHROPLASTY Left 11/06/2018   Procedure: LEFT TOTAL HIP ARTHROPLASTY ANTERIOR APPROACH;  Surgeon: Kathryne Hitch, MD;  Location: [facility] OR;  Service: Orthopedics;  Laterality: Left;   Patient Active Problem List   Diagnosis Date Noted   Status post total replacement of left hip 11/06/2018   Unilateral primary osteoarthritis, left hip 08/29/2018   Chronic lymphoblastic leukemia 01/21/2011   Allergic rhinitis due to pollen 08/16/2010   CONJUNCTIVITIS, ALLERGIC 01/23/2008    PCP: Merri Brunette, MD   REFERRING PROVIDER: Kirtland Bouchard, PA-C   REFERRING DIAG: [phone] (ICD-10-CM) - Acute right-sided low back pain with right-sided sciatica   THERAPY DIAG:  Acute right-sided low back pain with right-sided sciatica  Radiculopathy, lumbar region  Muscle weakness (generalized)  Muscle spasm of back  RATIONALE FOR EVALUATION AND TREATMENT: Rehabilitation  ONSET DATE: ~1 month  NEXT MD VISIT:  PRN   SUBJECTIVE:                                                                                                                                                                                                          SUBJECTIVE STATEMENT: Pt reports on his feet for 10K+ steps per day during [employer] since last Wed - no back pain but notes some calf pain/discomfort noted. He has been wearing compression socks which helps some.  PAIN: Are you having pain? No  PERTINENT HISTORY:  Arthritis; L hip OA s/p L THA 11/06/18; Chronic lymphoblastic leukemia; CLL (chronic lymphocytic leukemia  PRECAUTIONS: None  WEIGHT BEARING RESTRICTIONS: No  FALLS:  Has patient fallen in last 6 months? No  LIVING ENVIRONMENT: Lives with: lives with their spouse Lives in: House/apartment Stairs: No Has following equipment at home: None  OCCUPATION: Semi-retired  PLOF: Independent and Leisure: gym 4-5 days/wk - 30-45 min cardio with weights qod; hiking; active in church  PATIENT GOALS: "calm the pain down to a manageable level or make it go away"   OBJECTIVE: (objective measures completed at initial evaluation unless otherwise dated)  DIAGNOSTIC FINDINGS:  08/16/22 - XR Lumbar Spine:  Lumbar spine 2 views: Lower lumbar spine facet changes. Degenerative disc disease at L5-S1 and L3-4.  Endplate spurring throughout the lumbar spine.  No acute fractures.  No spondylolisthesis.   08/16/22 - XR Right hip w/ or w/o pelvis:  AP pelvis lateral view right hip: Bilateral hips are well located.  Status post left total hip arthroplasty well-seated components.  No acute fractures of either hip.  Right hip joints well-maintained.    PATIENT SURVEYS:  Modified Oswestry 6 / 50 = 12.0 %  FOTO Lumbar spine = 76, predicted = 81  SCREENING FOR RED FLAGS: Bowel or bladder incontinence: No Spinal tumors: No Cauda equina syndrome: No Compression fracture: No Abdominal aneurysm: No  COGNITION:  Overall cognitive status: Within  functional limits for tasks assessed    SENSATION: L LE radiculopathy - intermittent sensation of "wetness" in lateral R calf  MUSCLE LENGTH: Hamstrings: WNL ITB: mod tight B Piriformis: WFL Hip flexors: mod tight B Quads: mod tight B Heelcord: WFL but tighter on R  POSTURE:  No Significant postural limitations  PALPATION: Increased muscle tension in R>L lumbar paraspinals and glutes/piriformis  LUMBAR ROM:   Active  AROM  eval  Flexion WNL  Extension 50% limited  Right lateral flexion Hand to lateral knee  Left lateral flexion Hand to lateral knee  Right rotation WNL  Left rotation WNL  (Blank rows = not tested)  LOWER EXTREMITY ROM:    B LE ROM WFL other than limited hip IR bilaterally  LOWER EXTREMITY MMT:    MMT Right eval Left eval  Hip flexion 4+ 4+  Hip extension 4 4+  Hip abduction 4+ 4+  Hip adduction 4+ 4+  Hip internal rotation 5 5  Hip external rotation 4 4  Knee flexion 5 5  Knee extension 5 5  Ankle dorsiflexion 5 5  Ankle plantarflexion 5 5  Ankle inversion    Ankle eversion     (Blank rows = not tested)  LUMBAR SPECIAL TESTS:  Straight leg raise test: Positive and FABER test: Positive   TODAY'S TREATMENT:   09/20/22 THERAPEUTIC EXERCISE: to improve flexibility, strength and mobility.  Verbal and tactile cues throughout for technique.  Rec Bike - L4 x 6 min Full plank + alt hip extension 2 x 10 Standing gastroc stretches: R/L runner's stretch at wall x 30" each R/L toes on wall x 30" each R/L negative heel over edge of step x 30" TRX squat 10 x 5" TRX triple extension squat x 10 TRX lunges 10 x 3" each bil 90/90 dead bug 2 x 10 Sustained bridge + blue TB clams 2 x 10 S/L blue TB clam 10 x 3" bil Side plank + blue TB clam x 10 bil   09/14/22 THERAPEUTIC EXERCISE: to improve flexibility, strength and mobility.  Verbal and  tactile cues throughout for technique.  Elliptical - L2.0 x 6 min Functional squat with B shoulder flexion  holding 5# db bil 3 x 20"  Functional squat with B shoulder flexion/extension holding 5# db bil 3 x 5 reps of arm motion Full plank 3 x 20-30" hold Full plank + alt hip extension 3 x 5 BATCA cable pallof press 5# + short-arc trunk rotation x 10 bil Standing GTB fire hydrant x 10 B side stepping with GTB at ankles 2 x 25 ft Fwd/back GTB monster walk 2 x 25 ft Staggered stance blue TB rows 2 x 10, 1 set with each foot fwd - cues for TrA isometric Staggered stance blue TB scap retraction + B shoulder extension 2 x 10, 1 set with each foot fwd - cues for TrA isometric   09/09/22 THERAPEUTIC EXERCISE: to improve flexibility, strength and mobility.  Verbal and tactile cues throughout for technique.  TM 1.5-2.0 mph x 6 min Supine core bicycles with looped GTB at forefeet 3 x 10 Bridge + GTB hip ABD 3 x 10 Quadruped GTB fire hydrants 2 x 10 each, cues to avoid twisting back Quadruped alt bird dog 2 x 10, cues to maintain level back Seated 3-way prayer stretch with green Pball 3 x 30" each position BATCA cable pallof press 5# x 10 bil  MANUAL THERAPY: To promote normalized muscle tension, improved flexibility, and reduced pain. STM/DTM and manual TPR to L piriformis  Provided demonstration of self-STM to glutes/piriformis and lumbar paraspinals using tennis ball on wall and/or foam roller   PATIENT EDUCATION:  Education details: HEP update Person educated: Patient Education method: Explanation, Demonstration, and MedBridgeGO code texted to patient Education comprehension: verbalized understanding, returned demonstration, and needs further education  HOME EXERCISE PROGRAM: *Access Code: [email or web address] URL: [email or web address] Date: 09/14/2022 Prepared by: Glenetta Hew  Exercises - Supine Iliotibial Band Stretch with Strap  - 2-3 x daily - 7 x weekly - 3 reps - 30 sec hold - Supine Quadriceps Stretch with Strap on Table  - 2-3 x daily - 7 x weekly - 3 reps - 30 sec hold -  Supine Lower Trunk Rotation  - 2-3 x daily - 7 x weekly - 5 reps - 10 sec hold - Supine Posterior Pelvic Tilt  - 2 x daily - 7 x weekly - 2 sets - 10 reps - 5 sec hold - Supine Bicycles  - 1 x daily - 7 x weekly - 3 sets - 10 reps - Isometric Dead Bug  - 1 x daily - 7 x weekly - 3 sets - 10 reps - Quadruped Hip Abduction with Resistance Loop  - 1 x daily - 7 x weekly - 2 sets - 10 reps - 3 sec hold  Patient Education - Hospital doctor  *Patient using MedBridgeGO app on phone  ASSESSMENT:  CLINICAL IMPRESSION: Areg reports good tolerance for being on his feet all day (10K+ steps) during [employer] with no back pain but did note some R calf discomfort. Introduced options for calf stretching to help improve flexibility and reduce muscle tension. Incorporated calf strengthening into TE today with some fatigue noted, therefore remainder of session focused on core engagement/strengthening. Levent is pleased with how he has been able to manage during the [employer] and will likely be ready to transition to his HEP within the next few visits pending no further issues identified on goal assessment.  OBJECTIVE IMPAIRMENTS: decreased activity tolerance, decreased knowledge of condition, decreased mobility,  difficulty walking, decreased ROM, decreased strength, increased fascial restrictions, impaired perceived functional ability, increased muscle spasms, impaired flexibility, improper body mechanics, and pain.   ACTIVITY LIMITATIONS: carrying, lifting, standing, and locomotion level  PARTICIPATION LIMITATIONS: community activity and occupation  PERSONAL FACTORS: Past/current experiences, Time since onset of injury/illness/exacerbation, and 3+ comorbidities: Arthritis; L hip OA s/p L THA 11/06/18; Chronic lymphoblastic leukemia; CLL (chronic lymphocytic leukemia  are also affecting patient's functional outcome.   REHAB POTENTIAL: Excellent  CLINICAL DECISION MAKING:  Stable/uncomplicated  EVALUATION COMPLEXITY: Low   GOALS: Goals reviewed with patient? Yes  SHORT TERM GOALS: Target date: 09/20/2022   Patient will be independent with initial HEP to improve outcomes and carryover.  Baseline: Initial HEP provided on eval Goal status: MET  09/09/22  2.  Patient will report centralization of radicular symptoms.  Baseline: R LE radicular pain and "wet" sensation to lateral calf Goal status: MET  09/09/22  LONG TERM GOALS: Target date: 10/11/2022   Patient will be independent with ongoing/advanced HEP for self-management at home.  Baseline:  Goal status: IN PROGRESS  2.  Patient will report 50-75% improvement in low back pain & R LE radiculopathy to improve QOL.  Baseline: 0/10, up to 5-7/10 Goal status: IN PROGRESS  3.  Patient to demonstrate ability to achieve and maintain good spinal alignment/posturing and body mechanics needed for daily activities. Baseline:  Goal status: IN PROGRESS  4.  Patient will demonstrate functional pain free lumbar ROM to perform ADLs.   Baseline: refer to above lumbar ROM table Goal status: IN PROGRESS  5.  Patient will demonstrate improved B proximal LE strength to 4+ to 5//5 for improved stability and ease of mobility . Baseline: refer to above MMT table Goal status: IN PROGRESS  6.  Patient will report 80 on lumbar FOTO to demonstrate improved functional ability.  Baseline: 76 Goal status: IN PROGRESS   7. Patient will report </= 4% on Modified Oswestry to demonstrate improved functional ability with decreased pain interference. Baseline: 6 / 50 = 12.0 % Goal status: IN PROGRESS  8.  Patient will tolerate >/= 30 min of standing w/o increased pain to allow for improved mobility and activity tolerance. Baseline: increased pain with prolonged standing Goal status: PARTIALLY MET  09/20/22 - Pt able to tolerate ~12 hrs days (10K+ steps) on his feet during [employer] w/o increased back pain, but did note some  calf discomfort   PLAN:  PT FREQUENCY: 2x/week  PT DURATION: 6 weeks  PLANNED INTERVENTIONS: Therapeutic exercises, Therapeutic activity, Neuromuscular re-education, Balance training, Gait training, Patient/Family education, Self Care, Joint mobilization, Aquatic Therapy, Dry Needling, Electrical stimulation, Spinal mobilization, Cryotherapy, Moist heat, Taping, Traction, Ultrasound, Ionotophoresis /ml Dexamethasone, Manual therapy, and Re-evaluation  PLAN FOR NEXT SESSION: Initiate LTG assessment and HEP review/update with potential for transition to HEP within next few visits; progress lumbopelvic ROM/flexibility and strengthening; MT +/- DN as indicated  PT order: for his lumbar spine to work on range of motion, core strengthening, stretching, include modalities and home exercise program.   Marry Guan, PT 09/20/2022, 9:39 AM

## 2022-09-23 ENCOUNTER — Encounter: Payer: Self-pay | Admitting: Physical Therapy

## 2022-09-23 ENCOUNTER — Ambulatory Visit: Payer: 59 | Admitting: Physical Therapy

## 2022-09-23 DIAGNOSIS — M5441 Lumbago with sciatica, right side: Secondary | ICD-10-CM

## 2022-09-23 DIAGNOSIS — M6281 Muscle weakness (generalized): Secondary | ICD-10-CM

## 2022-09-23 DIAGNOSIS — M62830 Muscle spasm of back: Secondary | ICD-10-CM

## 2022-09-23 DIAGNOSIS — M5416 Radiculopathy, lumbar region: Secondary | ICD-10-CM

## 2022-09-23 NOTE — Therapy (Signed)
OUTPATIENT PHYSICAL THERAPY TREATMENT   Patient Name: Christopher Hill MRN: [id number] DOB:05/07/1961, 62 y.o., male Today's Date: 09/23/2022  END OF SESSION:  PT End of Session - 09/23/22 0938     Visit Number 6    Date for PT Re-Evaluation 10/11/22    Authorization Type [employer]    PT Start Time [phone]   Pt arrived late   PT Stop Time 1024    PT Time Calculation (min) 46 min    Activity Tolerance Patient tolerated treatment well    Behavior During Therapy WFL for tasks assessed/performed               Past Medical History:  Diagnosis Date   Allergic rhinitis, cause unspecified    Arthritis    Chronic lymphoblastic leukemia    CLL (chronic lymphocytic leukemia) 2014   History of kidney stones    Hyperlipidemia    Liver cyst    Other chronic allergic conjunctivitis    Past Surgical History:  Procedure Laterality Date   CATARACT EXTRACTION Bilateral    COLONOSCOPY     ESOPHAGOGASTRODUODENOSCOPY     LITHOTRIPSY  1997, 1995   TONSILLECTOMY  06/06/1962   TOTAL HIP ARTHROPLASTY Left 11/06/2018   Procedure: LEFT TOTAL HIP ARTHROPLASTY ANTERIOR APPROACH;  Surgeon: Kathryne Hitch, MD;  Location: [facility] OR;  Service: Orthopedics;  Laterality: Left;   Patient Active Problem List   Diagnosis Date Noted   Status post total replacement of left hip 11/06/2018   Unilateral primary osteoarthritis, left hip 08/29/2018   Chronic lymphoblastic leukemia 01/21/2011   Allergic rhinitis due to pollen 08/16/2010   CONJUNCTIVITIS, ALLERGIC 01/23/2008    PCP: Merri Brunette, MD   REFERRING PROVIDER: Kirtland Bouchard, PA-C   REFERRING DIAG: [phone] (ICD-10-CM) - Acute right-sided low back pain with right-sided sciatica   THERAPY DIAG:  Acute right-sided low back pain with right-sided sciatica  Radiculopathy, lumbar region  Muscle weakness (generalized)  Muscle spasm of back  RATIONALE FOR EVALUATION AND TREATMENT: Rehabilitation  ONSET DATE: ~1 month  NEXT MD VISIT:   PRN   SUBJECTIVE:                                                                                                                                                                                                         SUBJECTIVE STATEMENT: Pt reports he survived [employer]. He notes he is stiff this morning having fallen asleep on the couch last night.  PAIN: Are you having pain? No  PERTINENT HISTORY:  Arthritis; L hip OA s/p L  THA 11/06/18; Chronic lymphoblastic leukemia; CLL (chronic lymphocytic leukemia  PRECAUTIONS: None  WEIGHT BEARING RESTRICTIONS: No  FALLS:  Has patient fallen in last 6 months? No  LIVING ENVIRONMENT: Lives with: lives with their spouse Lives in: House/apartment Stairs: No Has following equipment at home: None  OCCUPATION: Semi-retired  PLOF: Independent and Leisure: gym 4-5 days/wk - 30-45 min cardio with weights qod; hiking; active in church  PATIENT GOALS: "calm the pain down to a manageable level or make it go away"   OBJECTIVE: (objective measures completed at initial evaluation unless otherwise dated)  DIAGNOSTIC FINDINGS:  08/16/22 - XR Lumbar Spine:  Lumbar spine 2 views: Lower lumbar spine facet changes. Degenerative disc disease at L5-S1 and L3-4.  Endplate spurring throughout the lumbar spine.  No acute fractures.  No spondylolisthesis.   08/16/22 - XR Right hip w/ or w/o pelvis:  AP pelvis lateral view right hip: Bilateral hips are well located.  Status post left total hip arthroplasty well-seated components.  No acute fractures of either hip.  Right hip joints well-maintained.    PATIENT SURVEYS:  Modified Oswestry 6 / 50 = 12.0 %  FOTO Lumbar spine = 76, predicted = 81  SCREENING FOR RED FLAGS: Bowel or bladder incontinence: No Spinal tumors: No Cauda equina syndrome: No Compression fracture: No Abdominal aneurysm: No  COGNITION:  Overall cognitive status: Within functional limits for tasks assessed    SENSATION: L LE  radiculopathy - intermittent sensation of "wetness" in lateral R calf  MUSCLE LENGTH: Hamstrings: WNL ITB: mod tight B Piriformis: WFL Hip flexors: mod tight B Quads: mod tight B Heelcord: WFL but tighter on R  POSTURE:  No Significant postural limitations  PALPATION: Increased muscle tension in R>L lumbar paraspinals and glutes/piriformis  LUMBAR ROM:   Active  AROM  eval 09/23/22  Flexion WNL WNL  Extension 50% limited WNL  Right lateral flexion Hand to lateral knee Hand to fibular head  Left lateral flexion Hand to lateral knee Hand to fibular head - tight L flank  Right rotation WNL WNL  Left rotation WNL WNL  (Blank rows = not tested)  LOWER EXTREMITY ROM:    B LE ROM WFL other than limited hip IR bilaterally  LOWER EXTREMITY MMT:    MMT Right eval Left eval Right 09/23/22 Left 09/23/22  Hip flexion 4+ 4+ 5 5  Hip extension 4 4+ 5 5  Hip abduction 4+ 4+ 4+ 4+  Hip adduction 4+ 4+ 5 5  Hip internal rotation Hip external rotation 4 4 4+ 4  Knee flexion Knee extension Ankle dorsiflexion Ankle plantarflexion Ankle inversion      Ankle eversion       (Blank rows = not tested)  LUMBAR SPECIAL TESTS:  Straight leg raise test: Positive and FABER test: Positive   TODAY'S TREATMENT:   09/23/22 THERAPEUTIC EXERCISE: to improve flexibility, strength and mobility.  Verbal and tactile cues throughout for technique.  TM 1.5-2.0 mph x 6 min Reviewed gastroc stretches and added instructions to MedBridgeGO app code per patient request Side plank elbow to knee + GTB clam 2 x 10 bil L/R QL stretch in doorway 3 x 20" each  MANUAL THERAPY: To promote normalized muscle tension, improved flexibility, and reduced pain. Skilled palpation and monitoring of soft tissue during DN Trigger Point Dry-Needling  Treatment instructions: Expect mild to  moderate muscle soreness. Patient verbalized understanding of these instructions and  education. Patient Consent Given: Yes Education handout provided: Yes Muscles treated: R lateral gastroc Electrical stimulation performed: No Parameters: N/A Treatment response/outcome: Twitch Response Elicited and Palpable Increase in Muscle Length STM/DTM, manual TPR, XFM and pin & stretch to muscles addressed with DN STM/DTM, manual TPR and XFM to L piriformis and lower glutes  THERAPEUTIC ACTIVITIES: Lumbar ROM reassessment LE MMT   09/20/22 THERAPEUTIC EXERCISE: to improve flexibility, strength and mobility.  Verbal and tactile cues throughout for technique.  Rec Bike - L4 x 6 min Full plank + alt hip extension 2 x 10 Standing gastroc stretches: R/L runner's stretch at wall x 30" each R/L toes on wall x 30" each R/L negative heel over edge of step x 30" TRX squat 10 x 5" TRX triple extension squat x 10 TRX lunges 10 x 3" each bil 90/90 dead bug 2 x 10 Sustained bridge + blue TB clams 2 x 10 S/L blue TB clam 10 x 3" bil Side plank + blue TB clam x 10 bil   09/14/22 THERAPEUTIC EXERCISE: to improve flexibility, strength and mobility.  Verbal and tactile cues throughout for technique.  Elliptical - L2.0 x 6 min Functional squat with B shoulder flexion holding 5# db bil 3 x 20"  Functional squat with B shoulder flexion/extension holding 5# db bil 3 x 5 reps of arm motion Full plank 3 x 20-30" hold Full plank + alt hip extension 3 x 5 BATCA cable pallof press 5# + short-arc trunk rotation x 10 bil Standing GTB fire hydrant x 10 B side stepping with GTB at ankles 2 x 25 ft Fwd/back GTB monster walk 2 x 25 ft Staggered stance blue TB rows 2 x 10, 1 set with each foot fwd - cues for TrA isometric Staggered stance blue TB scap retraction + B shoulder extension 2 x 10, 1 set with each foot fwd - cues for TrA isometric   PATIENT EDUCATION:  Education details: HEP review, HEP update - QL stretch and side plank GTB clam, role of DN, and DN rational, procedure, outcomes,  potential side effects, and recommended post-treatment exercises/activity Person educated: Patient Education method: Explanation, Demonstration, and MedBridgeGO code updated Education comprehension: verbalized understanding and returned demonstration  HOME EXERCISE PROGRAM: *Access Code: [email or web address] URL: [email or web address] Date: 09/23/2022 Prepared by: Glenetta Hew  Exercises - Supine Iliotibial Band Stretch with Strap  - 2-3 x daily - 7 x weekly - 3 reps - 30 sec hold - Supine Quadriceps Stretch with Strap on Table  - 2-3 x daily - 7 x weekly - 3 reps - 30 sec hold - Supine Lower Trunk Rotation  - 2-3 x daily - 7 x weekly - 5 reps - 10 sec hold - Supine Posterior Pelvic Tilt  - 2 x daily - 7 x weekly - 2 sets - 10 reps - 5 sec hold - Supine Bicycles  - 1 x daily - 7 x weekly - 3 sets - 10 reps - Isometric Dead Bug  - 1 x daily - 7 x weekly - 3 sets - 10 reps - Quadruped Hip Abduction with Resistance Loop  - 1 x daily - 3 x weekly - 2 sets - 10 reps - 3 sec hold - Side Plank with Clam and Resistance  - 1 x daily - 3 x weekly - 2 sets - 10 reps - 3 sec hold - Gastroc Stretch with Foot at Wall  - 2  x daily - 7 x weekly - 3 reps - 30 sec hold - Standing Gastroc Stretch on Step  - 2 x daily - 7 x weekly - 3 reps - 30 sec hold - Gastroc Stretch on Wall  - 2 x daily - 7 x weekly - 3 reps - 30 sec hold - Standing Quadratus Lumborum Stretch with Doorway  - 2 x daily - 7 x weekly - 3 reps - 30 sec hold  Patient Education - Hospital doctor - Trigger Point Dry Needling  *Patient using MedBridgeGO app on phone   ASSESSMENT:  CLINICAL IMPRESSION: Kesean reports he was able to complete [employer] without limitation due to back pain, however did experience some R calf discomfort.  We reviewed the stretches initiated last visit and updated HEP per patient request.  Taut tender bands palpated in R lateral gastroc which appeared amenable to DN.  After explanation of  DN rational, procedures, outcomes and potential side effects,  patient verbalized consent to DN treatment in conjunction with manual STM/DTM and TPR to reduce ttp/muscle tension. Muscles treated as indicated above. DN produced normal response with good twitches elicited resulting in palpable reduction in pain/ttp and muscle tension. Pt educated to expect mild to moderate muscle soreness for up to 24-48 hrs and instructed to continue prescribed HEP and current activity level with pt verbalizing understanding of these instructions.  Increased muscle tension also noted in left piriformis and lower glutes which was addressed primarily with MT but may benefit from DN if persists next visit.  Jian is demonstrating good progress with physical therapy with lumbar range of motion now [facility], only noting tightness in left flank with lateral flexion.  Good gains noted in proximal lower extremity strength with only mild weakness persisting in B hip ABD and L>R hip ER - HEP updated to continue to target these areas of weakness.  Tavari remains pleased with his progress and will likely be ready to transition to his HEP within the next few visits pending no further issues identified on remaining goal assessment.  OBJECTIVE IMPAIRMENTS: decreased activity tolerance, decreased knowledge of condition, decreased mobility, difficulty walking, decreased ROM, decreased strength, increased fascial restrictions, impaired perceived functional ability, increased muscle spasms, impaired flexibility, improper body mechanics, and pain.   ACTIVITY LIMITATIONS: carrying, lifting, standing, and locomotion level  PARTICIPATION LIMITATIONS: community activity and occupation  PERSONAL FACTORS: Past/current experiences, Time since onset of injury/illness/exacerbation, and 3+ comorbidities: Arthritis; L hip OA s/p L THA 11/06/18; Chronic lymphoblastic leukemia; CLL (chronic lymphocytic leukemia  are also affecting patient's functional outcome.    REHAB POTENTIAL: Excellent  CLINICAL DECISION MAKING: Stable/uncomplicated  EVALUATION COMPLEXITY: Low   GOALS: Goals reviewed with patient? Yes  SHORT TERM GOALS: Target date: 09/20/2022   Patient will be independent with initial HEP to improve outcomes and carryover.  Baseline: Initial HEP provided on eval Goal status: MET  09/09/22  2.  Patient will report centralization of radicular symptoms.  Baseline: R LE radicular pain and "wet" sensation to lateral calf Goal status: MET  09/09/22  LONG TERM GOALS: Target date: 10/11/2022   Patient will be independent with ongoing/advanced HEP for self-management at home.  Baseline:  Goal status: IN PROGRESS  09/23/22 - HEP reviewed and updated  2.  Patient will report 50-75% improvement in low back pain & R LE radiculopathy to improve QOL.  Baseline: 0/10, up to 5-7/10 Goal status: IN PROGRESS  3.  Patient to demonstrate ability to achieve and maintain  good spinal alignment/posturing and body mechanics needed for daily activities. Baseline:  Goal status: IN PROGRESS  4.  Patient will demonstrate functional pain free lumbar ROM to perform ADLs.   Baseline: refer to above lumbar ROM table Goal status: IN PROGRESS  09/23/22 - lumbar ROM WFL however increased tension noted in left flank with lateral flexion  5.  Patient will demonstrate improved B proximal LE strength to 4+ to 5//5 for improved stability and ease of mobility . Baseline: refer to above MMT table Goal status: PARTIALLY MET  09/23/22 - met except L hip ER 4/5  6.  Patient will report 27 on lumbar FOTO to demonstrate improved functional ability.  Baseline: 76 Goal status: IN PROGRESS   7. Patient will report </= 4% on Modified Oswestry to demonstrate improved functional ability with decreased pain interference. Baseline: 6 / 50 = 12.0 % Goal status: IN PROGRESS  8.  Patient will tolerate >/= 30 min of standing w/o increased pain to allow for improved mobility and activity  tolerance. Baseline: increased pain with prolonged standing Goal status: PARTIALLY MET  09/20/22 - Pt able to tolerate ~12 hrs days (10K+ steps) on his feet during [employer] w/o increased back pain, but did note some calf discomfort   PLAN:  PT FREQUENCY: 2x/week  PT DURATION: 6 weeks  PLANNED INTERVENTIONS: Therapeutic exercises, Therapeutic activity, Neuromuscular re-education, Balance training, Gait training, Patient/Family education, Self Care, Joint mobilization, Aquatic Therapy, Dry Needling, Electrical stimulation, Spinal mobilization, Cryotherapy, Moist heat, Taping, Traction, Ultrasound, Ionotophoresis 4mg /ml Dexamethasone, Manual therapy, and Re-evaluation  PLAN FOR NEXT SESSION: Assess response to DN; continue LTG assessment and HEP review/update with potential for transition to HEP within next few visits; progress lumbopelvic ROM/flexibility and strengthening; MT +/- DN as indicated  PT order: for his lumbar spine to work on range of motion, core strengthening, stretching, include modalities and home exercise program.   Marry Guan, PT 09/23/2022, 1:13 PM

## 2022-09-27 ENCOUNTER — Ambulatory Visit: Payer: 59 | Admitting: Physical Therapy

## 2022-09-27 ENCOUNTER — Encounter: Payer: Self-pay | Admitting: Physical Therapy

## 2022-09-27 DIAGNOSIS — M5441 Lumbago with sciatica, right side: Secondary | ICD-10-CM

## 2022-09-27 DIAGNOSIS — M62830 Muscle spasm of back: Secondary | ICD-10-CM

## 2022-09-27 DIAGNOSIS — M6281 Muscle weakness (generalized): Secondary | ICD-10-CM

## 2022-09-27 DIAGNOSIS — M5416 Radiculopathy, lumbar region: Secondary | ICD-10-CM

## 2022-09-27 NOTE — Therapy (Addendum)
OUTPATIENT PHYSICAL THERAPY TREATMENT / DISCHARGE SUMMARY   Patient Name: Christopher Hill MRN: [id number] DOB:01/21/1961, 62 y.o., male Today's Date: 09/27/2022  END OF SESSION:  PT End of Session - 09/27/22 0935     Visit Number 7    Date for PT Re-Evaluation 10/11/22    Authorization Type [employer]    PT Start Time 0935    PT Stop Time 1020    PT Time Calculation (min) 45 min    Activity Tolerance Patient tolerated treatment well    Behavior During Therapy WFL for tasks assessed/performed               Past Medical History:  Diagnosis Date   Allergic rhinitis, cause unspecified    Arthritis    Chronic lymphoblastic leukemia    CLL (chronic lymphocytic leukemia) 2014   History of kidney stones    Hyperlipidemia    Liver cyst    Other chronic allergic conjunctivitis    Past Surgical History:  Procedure Laterality Date   CATARACT EXTRACTION Bilateral    COLONOSCOPY     ESOPHAGOGASTRODUODENOSCOPY     LITHOTRIPSY  1997, 1995   TONSILLECTOMY  06/06/1962   TOTAL HIP ARTHROPLASTY Left 11/06/2018   Procedure: LEFT TOTAL HIP ARTHROPLASTY ANTERIOR APPROACH;  Surgeon: Kathryne Hitch, MD;  Location: [facility] OR;  Service: Orthopedics;  Laterality: Left;   Patient Active Problem List   Diagnosis Date Noted   Status post total replacement of left hip 11/06/2018   Unilateral primary osteoarthritis, left hip 08/29/2018   Chronic lymphoblastic leukemia 01/21/2011   Allergic rhinitis due to pollen 08/16/2010   CONJUNCTIVITIS, ALLERGIC 01/23/2008    PCP: Merri Brunette, MD   REFERRING PROVIDER: Kirtland Bouchard, PA-C   REFERRING DIAG: [phone] (ICD-10-CM) - Acute right-sided low back pain with right-sided sciatica   THERAPY DIAG:  Acute right-sided low back pain with right-sided sciatica  Radiculopathy, lumbar region  Muscle weakness (generalized)  Muscle spasm of back  RATIONALE FOR EVALUATION AND TREATMENT: Rehabilitation  ONSET DATE: ~1 month  NEXT MD VISIT:   PRN   SUBJECTIVE:                                                                                                                                                                                                         SUBJECTIVE STATEMENT: Pt reports the pain/tightness in his calf moved lower down following the DN. He also notes increased LBP after using the arc trainer at the gym - pain bad enough to cause him to wear his back brace and  have to take a muscle relaxant. Back pain now resolved, but still noting some soreness in R mid/lower calf.  PAIN: Are you having pain? No  PERTINENT HISTORY:  Arthritis; L hip OA s/p L THA 11/06/18; Chronic lymphoblastic leukemia; CLL (chronic lymphocytic leukemia  PRECAUTIONS: None  WEIGHT BEARING RESTRICTIONS: No  FALLS:  Has patient fallen in last 6 months? No  LIVING ENVIRONMENT: Lives with: lives with their spouse Lives in: House/apartment Stairs: No Has following equipment at home: None  OCCUPATION: Semi-retired  PLOF: Independent and Leisure: gym 4-5 days/wk - 30-45 min cardio with weights qod; hiking; active in church  PATIENT GOALS: "calm the pain down to a manageable level or make it go away"   OBJECTIVE: (objective measures completed at initial evaluation unless otherwise dated)  DIAGNOSTIC FINDINGS:  08/16/22 - XR Lumbar Spine:  Lumbar spine 2 views: Lower lumbar spine facet changes. Degenerative disc disease at L5-S1 and L3-4.  Endplate spurring throughout the lumbar spine.  No acute fractures.  No spondylolisthesis.   08/16/22 - XR Right hip w/ or w/o pelvis:  AP pelvis lateral view right hip: Bilateral hips are well located.  Status post left total hip arthroplasty well-seated components.  No acute fractures of either hip.  Right hip joints well-maintained.    PATIENT SURVEYS:  Modified Oswestry 6 / 50 = 12.0 %  FOTO Lumbar spine = 76, predicted = 81  09/27/22: Modified Oswestry: 4 / 50 = 8.0 % Lumbar FOTO = 83  SCREENING  FOR RED FLAGS: Bowel or bladder incontinence: No Spinal tumors: No Cauda equina syndrome: No Compression fracture: No Abdominal aneurysm: No  COGNITION:  Overall cognitive status: Within functional limits for tasks assessed    SENSATION: L LE radiculopathy - intermittent sensation of "wetness" in lateral R calf  MUSCLE LENGTH: Hamstrings: WNL ITB: mod tight B Piriformis: WFL Hip flexors: mod tight B Quads: mod tight B Heelcord: WFL but tighter on R  POSTURE:  No Significant postural limitations  PALPATION: Increased muscle tension in R>L lumbar paraspinals and glutes/piriformis  LUMBAR ROM:   Active  AROM  eval 09/23/22  Flexion WNL WNL  Extension 50% limited WNL  Right lateral flexion Hand to lateral knee Hand to fibular head  Left lateral flexion Hand to lateral knee Hand to fibular head - tight L flank  Right rotation WNL WNL  Left rotation WNL WNL  (Blank rows = not tested)  LOWER EXTREMITY ROM:    B LE ROM WFL other than limited hip IR bilaterally  LOWER EXTREMITY MMT:    MMT Right eval Left eval Right 09/23/22 Left 09/23/22  Hip flexion 4+ 4+ 5 5  Hip extension 4 4+ 5 5  Hip abduction 4+ 4+ 4+ 4+  Hip adduction 4+ 4+ 5 5  Hip internal rotation 5 5 5 5   Hip external rotation 4 4 4+ 4  Knee flexion 5 5 5 5   Knee extension 5 5 5 5   Ankle dorsiflexion 5 5 5 5   Ankle plantarflexion 5 5 5 5   Ankle inversion      Ankle eversion       (Blank rows = not tested)  LUMBAR SPECIAL TESTS:  Straight leg raise test: Positive and FABER test: Positive   TODAY'S TREATMENT:   09/27/22 THERAPEUTIC EXERCISE: to improve flexibility, strength and mobility.  Verbal and tactile cues throughout for technique.  Rec Bike - L4 x 6 min Standing R gastroc runner's stretch 2 x 30" Standing R soleus runner's stretch 2  x 30" Verbally reviewed HEP answering patient questions  MANUAL THERAPY: To promote normalized muscle tension, improved flexibility, increased ROM, and reduced  pain. Skilled palpation and monitoring of soft tissue during DN Trigger Point Dry-Needling  Treatment instructions: Expect mild to moderate muscle soreness. S/S of pneumothorax if dry needled over a lung field, and to seek immediate medical attention should they occur. Patient verbalized understanding of these instructions and education. Patient Consent Given: Yes Education handout provided: Previously provided Muscles treated: R lateral gastroc and soleus Electrical stimulation performed: No Parameters: N/A Treatment response/outcome: Twitch Response Elicited and Palpable Increase in Muscle Length STM/DTM, manual TPR and pin & stretch to muscles addressed with DN Instruction in foam rolling for calf muscles  THERAPEUTIC ACTIVITIES: Lumbar FOTO = 83 Modified Oswestry: 4 / 50 = 8.0 % Answered patient questions related to use of brace as well as positioning, specifically crossing legs while sitting   09/23/22 THERAPEUTIC EXERCISE: to improve flexibility, strength and mobility.  Verbal and tactile cues throughout for technique.  TM 1.5-2.0 mph x 6 min Reviewed gastroc stretches and added instructions to MedBridgeGO app code per patient request Side plank elbow to knee + GTB clam 2 x 10 bil L/R QL stretch in doorway 3 x 20" each  MANUAL THERAPY: To promote normalized muscle tension, improved flexibility, and reduced pain. Skilled palpation and monitoring of soft tissue during DN Trigger Point Dry-Needling  Treatment instructions: Expect mild to moderate muscle soreness. Patient verbalized understanding of these instructions and education. Patient Consent Given: Yes Education handout provided: Yes Muscles treated: R lateral gastroc Electrical stimulation performed: No Parameters: N/A Treatment response/outcome: Twitch Response Elicited and Palpable Increase in Muscle Length STM/DTM, manual TPR, XFM and pin & stretch to muscles addressed with DN STM/DTM, manual TPR and XFM to L  piriformis and lower glutes  THERAPEUTIC ACTIVITIES: Lumbar ROM reassessment LE MMT   09/20/22 THERAPEUTIC EXERCISE: to improve flexibility, strength and mobility.  Verbal and tactile cues throughout for technique.  Rec Bike - L4 x 6 min Full plank + alt hip extension 2 x 10 Standing gastroc stretches: R/L runner's stretch at wall x 30" each R/L toes on wall x 30" each R/L negative heel over edge of step x 30" TRX squat 10 x 5" TRX triple extension squat x 10 TRX lunges 10 x 3" each bil 90/90 dead bug 2 x 10 Sustained bridge + blue TB clams 2 x 10 S/L blue TB clam 10 x 3" bil Side plank + blue TB clam x 10 bil   PATIENT EDUCATION:  Education details: HEP review, HEP update - QL stretch and side plank GTB clam, role of DN, and DN rational, procedure, outcomes, potential side effects, and recommended post-treatment exercises/activity Person educated: Patient Education method: Explanation, Demonstration, and MedBridgeGO code updated Education comprehension: verbalized understanding and returned demonstration  HOME EXERCISE PROGRAM: *Access Code: [email or web address] URL: [email or web address] Date: 09/23/2022 Prepared by: Glenetta Hew  Exercises - Supine Iliotibial Band Stretch with Strap  - 2-3 x daily - 7 x weekly - 3 reps - 30 sec hold - Supine Quadriceps Stretch with Strap on Table  - 2-3 x daily - 7 x weekly - 3 reps - 30 sec hold - Supine Lower Trunk Rotation  - 2-3 x daily - 7 x weekly - 5 reps - 10 sec hold - Supine Posterior Pelvic Tilt  - 2 x daily - 7 x weekly - 2 sets - 10 reps - 5 sec hold - Supine Bicycles  -  1 x daily - 7 x weekly - 3 sets - 10 reps - Isometric Dead Bug  - 1 x daily - 7 x weekly - 3 sets - 10 reps - Quadruped Hip Abduction with Resistance Loop  - 1 x daily - 3 x weekly - 2 sets - 10 reps - 3 sec hold - Side Plank with Clam and Resistance  - 1 x daily - 3 x weekly - 2 sets - 10 reps - 3 sec hold - Gastroc Stretch with Foot at Wall  - 2 x daily  - 7 x weekly - 3 reps - 30 sec hold - Standing Gastroc Stretch on Step  - 2 x daily - 7 x weekly - 3 reps - 30 sec hold - Gastroc Stretch on Wall  - 2 x daily - 7 x weekly - 3 reps - 30 sec hold - Standing Quadratus Lumborum Stretch with Doorway  - 2 x daily - 7 x weekly - 3 reps - 30 sec hold  Patient Education - Hospital doctor - Trigger Point Dry Needling  *Patient using MedBridgeGO app on phone   ASSESSMENT:  CLINICAL IMPRESSION: Mitchelle reports episode of increased back pain following use of the arc trainer at the gym causing him to revert to using his back brace temporarily as well as taking muscle relaxant.  Pain now resolved but persistent tightness still evident in R calf, although now lower than previously with benefit noted from DN last session.  Additional taut bands identified in R lateral gastroc and soleus today which were addressed with further MT incorporating DN followed by review relevant stretching as well as instruction in use of foam roller to promote continued normalization of muscle tension.  Verbally reviewed lumbar HEP with patient denying any concerns at the moment.  He currently has 2 visits remaining prior to when his insurance will run out at the end of the month but he is not certain that he will need these visits as he feels fairly comfortable with the home program.  Gains noted on both lumbar FOTO (LTG #6 met) and modified Oswestry.  All goals now met or at least partially met.  Yama is currently uncertain if he will need his remaining 2 visits or if he feels ready to transition to his HEP at this time.  He is aware that if he chooses not to return for the last remaining 2 scheduled visits, he can remain on hold for 30-days in the event that issues arise that would necessitate a return to PT.   OBJECTIVE IMPAIRMENTS: decreased activity tolerance, decreased knowledge of condition, decreased mobility, difficulty walking, decreased ROM, decreased strength,  increased fascial restrictions, impaired perceived functional ability, increased muscle spasms, impaired flexibility, improper body mechanics, and pain.   ACTIVITY LIMITATIONS: carrying, lifting, standing, and locomotion level  PARTICIPATION LIMITATIONS: community activity and occupation  PERSONAL FACTORS: Past/current experiences, Time since onset of injury/illness/exacerbation, and 3+ comorbidities: Arthritis; L hip OA s/p L THA 11/06/18; Chronic lymphoblastic leukemia; CLL (chronic lymphocytic leukemia  are also affecting patient's functional outcome.   REHAB POTENTIAL: Excellent  CLINICAL DECISION MAKING: Stable/uncomplicated  EVALUATION COMPLEXITY: Low   GOALS: Goals reviewed with patient? Yes  SHORT TERM GOALS: Target date: 09/20/2022   Patient will be independent with initial HEP to improve outcomes and carryover.  Baseline: Initial HEP provided on eval Goal status: MET  09/09/22  2.  Patient will report centralization of radicular symptoms.  Baseline: R LE radicular pain and "wet"  sensation to lateral calf Goal status: MET  09/09/22  LONG TERM GOALS: Target date: 10/11/2022   Patient will be independent with ongoing/advanced HEP for self-management at home.  Baseline:  Goal status: PARTIALLY MET  09/27/22 - Met for current HEP   2.  Patient will report 50-75% improvement in low back pain & R LE radiculopathy to improve QOL.  Baseline: 0/10, up to 5-7/10 Goal status: MET  09/27/22 - pt reports at least 50-75% improved, maybe more  3.  Patient to demonstrate ability to achieve and maintain good spinal alignment/posturing and body mechanics needed for daily activities. Baseline:  Goal status: MET  09/27/22  4.  Patient will demonstrate functional pain free lumbar ROM to perform ADLs.   Baseline: refer to above lumbar ROM table Goal status: PARTIALLY MET  09/23/22 - lumbar ROM WFL however increased tension noted in left flank with lateral flexion  5.  Patient will demonstrate  improved B proximal LE strength to 4+ to 5//5 for improved stability and ease of mobility . Baseline: refer to above MMT table Goal status: PARTIALLY MET  09/23/22 - met except L hip ER 4/5  6.  Patient will report 3 on lumbar FOTO to demonstrate improved functional ability.  Baseline: 76 Goal status: MET  09/27/22 - Lumbar FOTO = 83   7. Patient will report </= 4% on Modified Oswestry to demonstrate improved functional ability with decreased pain interference. Baseline: 6 / 50 = 12.0 % Goal status: PARTIALLY MET  09/27/22 - 4 / 50 = 8.0 %  8.  Patient will tolerate >/= 30 min of standing w/o increased pain to allow for improved mobility and activity tolerance. Baseline: increased pain with prolonged standing Goal status: PARTIALLY MET  09/20/22 - Pt able to tolerate ~12 hrs days (10K+ steps) on his feet during [employer] w/o increased back pain, but did note some calf discomfort   PLAN:  PT FREQUENCY: 2x/week  PT DURATION: 6 weeks  PLANNED INTERVENTIONS: Therapeutic exercises, Therapeutic activity, Neuromuscular re-education, Balance training, Gait training, Patient/Family education, Self Care, Joint mobilization, Aquatic Therapy, Dry Needling, Electrical stimulation, Spinal mobilization, Cryotherapy, Moist heat, Taping, Traction, Ultrasound, Ionotophoresis 4mg /ml Dexamethasone, Manual therapy, and Re-evaluation  PLAN FOR NEXT SESSION: Assess response to DN; finalize HEP review/update with potential for transition to HEP within next 2 visits; progress lumbopelvic ROM/flexibility and strengthening; MT +/- DN as indicated; further LTG assessment as indicated  PT order: for his lumbar spine to work on range of motion, core strengthening, stretching, include modalities and home exercise program.   Marry Guan, PT 09/27/2022, 12:20 PM   PHYSICAL THERAPY DISCHARGE SUMMARY  Visits from Start of Care: 7  Current functional level related to goals / functional outcomes: Refer to  above clinical impression and goal assessment for status as of last visit on 09/27/22. Patient cancelled his remaining scheduled visits and has not returned to PT in >30 days, therefore will proceed with discharge from PT for this episode.     Remaining deficits: As above. Unable to formally assess status at discharge due to failure to return to PT.    Education / Equipment: HEP   Patient agrees to discharge. Patient goals were partially met. Patient is being discharged due to the patient's request.  Marry Guan, PT 12/13/22, 11:30 AM  [facility] [address]  [address] [place], [place], [address] Phone: [phone]   Fax:  [phone]

## 2022-09-30 ENCOUNTER — Ambulatory Visit: Payer: 59

## 2022-10-03 ENCOUNTER — Ambulatory Visit: Payer: 59 | Admitting: Physical Therapy

## 2022-11-04 ENCOUNTER — Other Ambulatory Visit: Payer: Self-pay

## 2022-11-04 MED ORDER — METHOCARBAMOL 500 MG PO TABS: 500.0000 mg | ORAL_TABLET | Freq: Every evening | ORAL | 0 refills | Status: DC

## 2022-11-04 NOTE — Telephone Encounter (Signed)
From: [place] To: Office of [place], [place] Sent: 11/04/2022 11:01 AM EDT Subject: Medication Renewal Request  Refills have been requested for the following medications:   methocarbamol (ROBAXIN) 500 MG tablet [GILBERT CLARK]  Patient Comment: Pulled my back today (yard work). Undid my 4 weeksof PT.   Preferred pharmacy: [id number] - [place], [place] - [address] AT [address] Delivery method: [employer]

## 2022-11-24 ENCOUNTER — Other Ambulatory Visit: Payer: Self-pay | Admitting: Physician Assistant

## 2022-11-24 ENCOUNTER — Telehealth: Payer: Self-pay | Admitting: Physician Assistant

## 2022-11-24 NOTE — Telephone Encounter (Signed)
Advised pt

## 2022-11-24 NOTE — Telephone Encounter (Signed)
Already sent in today.

## 2022-11-24 NOTE — Telephone Encounter (Signed)
Patient called needing Rx refilled Gabapentin. The number to contact patient is [phone]

## 2022-12-06 ENCOUNTER — Ambulatory Visit: Payer: 59 | Admitting: Physician Assistant

## 2022-12-06 ENCOUNTER — Encounter: Payer: Self-pay | Admitting: Physician Assistant

## 2022-12-06 ENCOUNTER — Other Ambulatory Visit: Payer: Self-pay

## 2022-12-06 DIAGNOSIS — M5441 Lumbago with sciatica, right side: Secondary | ICD-10-CM

## 2022-12-06 NOTE — Progress Notes (Signed)
HPI: Christopher Hill returns today due to low back pain.  He has tried physical therapy, home exercise program, over-the-counter medications, methocarbamol and gabapentin.  Despite these conservative measures continues to have low back pain that radiates into his right buttocks and down to his right lateral calf.  Describes the buttocks pain is to take like the Pain to be achy.  At times he feels like there is fluid running down the lateral aspect of his leg.  He notes that the pain ebbs and flows.  7 no waking pain no bowel bladder dysfunction no unintentional weight loss.  He states he has increased back pain and symptoms down the right leg with abdominal workout.  Review of systems: See HPI otherwise negative or noncontributory.  Physical exam: General well-developed well-nourished male in no acute distress mood affect appropriate.  Ambulates without any assistive device. Psych: Alert and oriented x 3 Bilateral lower extremities positive straight leg raise on the right negative on the left.  5 out of 5 strength throughout the lower extremities.  Achilles are nontender bilaterally calf supple nontender bilaterally.  Nontender over the trochanteric region of both hips.  Good range of motion of bilateral hips without pain.  He has full forward flexion extension lumbar spine without pain.  Impression: Low back pain with radicular symptoms right leg  Given his failure of conservative treatment for his low back recommend MRI to rule out HNP as a source of his right radicular symptoms and low back pain.  Having follow-up after the MRI to go over results discuss further treatment.  Questions were encouraged and answered at length.

## 2022-12-09 MED ORDER — PREDNISONE 5 MG (21) PO TBPK: ORAL_TABLET | ORAL | 0 refills | Status: DC

## 2022-12-30 ENCOUNTER — Ambulatory Visit
Admission: RE | Admit: 2022-12-30 | Discharge: 2022-12-30 | Disposition: A | Discharge status: Home / Self Care | Payer: 59 | Source: Ambulatory Visit | Attending: Physician Assistant | Admitting: Physician Assistant

## 2022-12-30 DIAGNOSIS — M5441 Lumbago with sciatica, right side: Secondary | ICD-10-CM

## 2023-01-09 ENCOUNTER — Ambulatory Visit: Payer: 59 | Admitting: Physician Assistant

## 2023-01-09 ENCOUNTER — Encounter: Payer: Self-pay | Admitting: Physician Assistant

## 2023-01-09 DIAGNOSIS — M5441 Lumbago with sciatica, right side: Secondary | ICD-10-CM

## 2023-01-09 MED ORDER — METHOCARBAMOL 500 MG PO TABS: 500.0000 mg | ORAL_TABLET | Freq: Every evening | ORAL | 0 refills | Status: DC

## 2023-01-09 NOTE — Progress Notes (Signed)
HPI: Mr. Mcclammy returns today to go over the MRI of his lumbar spine.  He states that PT has helped with the stretching but it is some of the poor exercises actually made his back pain worse.  Still having pain in his right buttocks area.  He has new numbness and occurs in the dorsal aspect of his midfoot with all of his toes.  He has pain in his right calf also.  No symptoms on the left side.  He states that his right leg pain ebbs and flows.  He does have some waking pain at times.  He has constipation but no bladder incontinence.  He continues to take Robaxin sometimes at night to help with the pain in the right leg.  MRI :MRI images reviewed with the patient.  MRI of the lumbar spine dated 12/30/2022 shows no significant spinal stenosis throughout.  L2-3 mild right greater than left foraminal narrowing L3-4 mild left greater than right foraminal narrowing L4-5 moderate left and mild right foraminal narrowing.  L5-S1 pars defect resulting in 11 mm of anterior spondylolisthesis this results in severe foraminal narrowing bilaterally with bilateral L5 nerve root encroachment.  Impression: Right lumbar radiculopathy  Plan: Will send him for epidural steroid injection most likely L5-S1 foraminal injection with Dr. Alvester Morin.  He will call us 2 weeks after that let us know what type of relief he experiences with this.  Questions were encouraged and answered at length.  Did refill his Robaxin today.  Questions were encouraged and answered at length

## 2023-01-09 NOTE — Addendum Note (Signed)
Addended by: Rogers Seeds on: 01/09/2023 04:40 PM   Modules accepted: Orders

## 2023-01-12 ENCOUNTER — Ambulatory Visit: Payer: 59 | Admitting: Physician Assistant

## 2023-01-13 ENCOUNTER — Telehealth: Payer: Self-pay | Admitting: Physical Medicine and Rehabilitation

## 2023-01-13 NOTE — Telephone Encounter (Signed)
Patient returned call to schedule an appointment with Dr. Alvester Morin. The number to contact patient is [phone]

## 2023-01-16 NOTE — Telephone Encounter (Signed)
See MyChart message

## 2023-01-31 ENCOUNTER — Ambulatory Visit: Payer: 59 | Admitting: Physical Medicine and Rehabilitation

## 2023-01-31 ENCOUNTER — Other Ambulatory Visit: Payer: Self-pay

## 2023-01-31 VITALS — BP 133/80 | HR 66

## 2023-01-31 DIAGNOSIS — M5416 Radiculopathy, lumbar region: Secondary | ICD-10-CM | POA: Diagnosis not present

## 2023-01-31 DIAGNOSIS — Q762 Congenital spondylolisthesis: Secondary | ICD-10-CM

## 2023-01-31 DIAGNOSIS — M4316 Spondylolisthesis, lumbar region: Secondary | ICD-10-CM

## 2023-01-31 MED ORDER — METHYLPREDNISOLONE ACETATE 80 MG/ML IJ SUSP
80.0000 mg | Freq: Once | INTRAMUSCULAR | Status: AC
  Administered 2023-01-31: 80 mg

## 2023-01-31 NOTE — Progress Notes (Signed)
Functional Pain Scale - descriptive words and definitions  Uncomfortable (3)  Pain is present but can complete all ADL's/sleep is slightly affected and passive distraction only gives marginal relief. Mild range order  Average Pain 5-6   +Driver, -BT, -Dye Allergies.  Lower back pain on right side with most pain radiating from right buttocks to right foot

## 2023-01-31 NOTE — Patient Instructions (Signed)

## 2023-02-04 NOTE — Progress Notes (Signed)
Christopher Hill - 62 y.o. male MRN [id number]  Date of birth: 09-10-1960  Office Visit Note: Visit Date: 01/31/2023 PCP: Merri Brunette, MD Referred by: Merri Brunette, MD  Subjective: Chief Complaint  Patient presents with   Lower Back - Pain   HPI:  Christopher Hill is a 62 y.o. male who comes in today at the request of Rexene Edison, PA-C for planned Right L5-S1 Lumbar Interlaminar epidural steroid injection with fluoroscopic guidance.  The patient has failed conservative care including home exercise, medications, time and activity modification.  This injection will be diagnostic and hopefully therapeutic.  Please see requesting physician notes for further details and justification.  Consider transforaminal approach.      ROS Otherwise per HPI.  Assessment & Plan: Visit Diagnoses:    ICD-10-CM   1. Lumbar radiculopathy  M54.16 XR C-ARM NO REPORT    Epidural Steroid injection    methylPREDNISolone acetate (DEPO-MEDROL) injection 80 mg    2. Spondylolisthesis of lumbar region  M43.16     3. Congenital spondylolysis  Q76.2       Plan: No additional findings.   Meds & Orders:  Meds ordered this encounter  Medications   methylPREDNISolone acetate (DEPO-MEDROL) injection 80 mg    Orders Placed This Encounter  Procedures   XR C-ARM NO REPORT   Epidural Steroid injection    Follow-up: Return for visit to requesting provider as needed.   Procedures: No procedures performed  Lumbar Epidural Steroid Injection - Interlaminar Approach with Fluoroscopic Guidance  Patient: Christopher Hill      Date of Birth: 07/28/1960 MRN: [id number] PCP: Merri Brunette, MD      Visit Date: 01/31/2023   Universal Protocol:     Consent Given By: the patient  Position: PRONE  Additional Comments: Vital signs were monitored before and after the procedure. Patient was prepped and draped in the usual sterile fashion. The correct patient, procedure, and site was verified.   Injection  Procedure Details:   Procedure diagnoses: Lumbar radiculopathy [M54.16]   Meds Administered:  Meds ordered this encounter  Medications   methylPREDNISolone acetate (DEPO-MEDROL) injection 80 mg     Laterality: Right  Location/Site:  L5-S1  Needle: 3.5 in., 20 ga. Tuohy  Needle Placement: Paramedian epidural  Findings:   -Comments: Excellent flow of contrast into the epidural space.  Procedure Details: Using a paramedian approach from the side mentioned above, the region overlying the inferior lamina was localized under fluoroscopic visualization and the soft tissues overlying this structure were infiltrated with 4 ml. of 1% Lidocaine without Epinephrine. The Tuohy needle was inserted into the epidural space using a paramedian approach.   The epidural space was localized using loss of resistance along with counter oblique bi-planar fluoroscopic views.  After negative aspirate for air, blood, and CSF, a 2 ml. volume of Isovue-250 was injected into the epidural space and the flow of contrast was observed. Radiographs were obtained for documentation purposes.    The injectate was administered into the level noted above.   Additional Comments:  No complications occurred Dressing: 2 x 2 sterile gauze and Band-Aid    Post-procedure details: Patient was observed during the procedure. Post-procedure instructions were reviewed.  Patient left the clinic in stable condition.   Clinical History: MRI LUMBAR SPINE WITHOUT CONTRAST   TECHNIQUE: Multiplanar, multisequence MR imaging of the lumbar spine was performed. No intravenous contrast was administered.   COMPARISON:  Lumbar spine radiographs 08/16/2022   FINDINGS: Segmentation: Conventional anatomy  assumed, with the last open disc space designated L5-S1.Concordant with prior radiographs   Alignment: 11 mm of anterolisthesis at L5-S1 secondary to chronic bilateral L5 pars defects. Otherwise normal.   Vertebrae: As above,  chronic appearing bilateral L5 pars defects. No evidence of acute fracture aggressive osseous lesion or discitis. There are mild endplate degenerative changes, greatest at L2-3. Mild sacroiliac degenerative changes bilaterally.   Conus medullaris: Extends to the L1 level and appears normal.   Paraspinal and other soft tissues: No significant paraspinal findings.   Disc levels:   Sagittal images demonstrate no significant disc space findings within the visualized lower thoracic spine. There is loss of disc height with disc bulging at T11-12.   L1-2: Preserved disc height with minimal disc bulging. No spinal stenosis or nerve root encroachment.   L2-3: Chronic degenerative disc disease with loss of disc height, disc bulging and endplate osteophytes. Mild facet and ligamentous hypertrophy. No significant central spinal stenosis. There is mild lateral recess narrowing bilaterally. In addition, there is mild right-greater-than-left foraminal narrowing.   L3-4: Mild loss of disc height with mild disc bulging, facet and ligamentous hypertrophy. No significant spinal stenosis or lateral recess narrowing. Mild left greater than right foraminal narrowing   L4-5: Moderate loss of disc height with annular disc bulging and a broad-based central disc protrusion. Mild facet and ligamentous hypertrophy. No significant central spinal stenosis. There is mild lateral recess narrowing bilaterally and moderate left greater than right foraminal narrowing.   L5-S1: Bilateral L5 pars defects with resulting anterolisthesis, diffuse disc bulging and uncovering. There is severe foraminal narrowing bilaterally with probable chronic bilateral L5 nerve root encroachment. The spinal canal and lateral recesses are sufficiently patent.   IMPRESSION: 1. Chronic bilateral L5 pars defects with resulting grade 2 anterolisthesis at L5-S1, contributing to severe foraminal narrowing bilaterally and probable  chronic bilateral L5 nerve root encroachment. 2. Chronic degenerative disc disease at L2-3 with resulting mild lateral recess and foraminal narrowing bilaterally. 3. Mild lateral recess and moderate foraminal narrowing bilaterally at L4-5. 4. No acute findings or significant central spinal stenosis.     Electronically Signed   By: Carey Bullocks M.D.   On: 01/09/2023 10:26     Objective:  VS:  HT:    WT:   BMI:     BP:133/80  HR:66bpm  TEMP: ( )  RESP:  Physical Exam Vitals and nursing note reviewed.  Constitutional:      General: He is not in acute distress.    Appearance: Normal appearance. He is not ill-appearing.  HENT:     Head: Normocephalic and atraumatic.     Right Ear: External ear normal.     Left Ear: External ear normal.     Nose: No congestion.  Eyes:     Extraocular Movements: Extraocular movements intact.  Cardiovascular:     Rate and Rhythm: Normal rate.     Pulses: Normal pulses.  Pulmonary:     Effort: Pulmonary effort is normal. No respiratory distress.  Abdominal:     General: There is no distension.     Palpations: Abdomen is soft.  Musculoskeletal:        General: No tenderness or signs of injury.     Cervical back: Neck supple.     Right lower leg: No edema.     Left lower leg: No edema.     Comments: Patient has good distal strength without clonus.  Skin:    Findings: No erythema or rash.  Neurological:  General: No focal deficit present.     Mental Status: He is alert and oriented to person, place, and time.     Sensory: No sensory deficit.     Motor: No weakness or abnormal muscle tone.     Coordination: Coordination normal.  Psychiatric:        Mood and Affect: Mood normal.        Behavior: Behavior normal.      Imaging: No results found.

## 2023-02-04 NOTE — Procedures (Signed)
Lumbar Epidural Steroid Injection - Interlaminar Approach with Fluoroscopic Guidance  Patient: Christopher Hill      Date of Birth: Apr 02, 1961 MRN: [id number] PCP: Merri Brunette, MD      Visit Date: 01/31/2023   Universal Protocol:     Consent Given By: the patient  Position: PRONE  Additional Comments: Vital signs were monitored before and after the procedure. Patient was prepped and draped in the usual sterile fashion. The correct patient, procedure, and site was verified.   Injection Procedure Details:   Procedure diagnoses: Lumbar radiculopathy [M54.16]   Meds Administered:  Meds ordered this encounter  Medications   methylPREDNISolone acetate (DEPO-MEDROL) injection 80 mg     Laterality: Right  Location/Site:  L5-S1  Needle: 3.5 in., 20 ga. Tuohy  Needle Placement: Paramedian epidural  Findings:   -Comments: Excellent flow of contrast into the epidural space.  Procedure Details: Using a paramedian approach from the side mentioned above, the region overlying the inferior lamina was localized under fluoroscopic visualization and the soft tissues overlying this structure were infiltrated with 4 ml. of 1% Lidocaine without Epinephrine. The Tuohy needle was inserted into the epidural space using a paramedian approach.   The epidural space was localized using loss of resistance along with counter oblique bi-planar fluoroscopic views.  After negative aspirate for air, blood, and CSF, a 2 ml. volume of Isovue-250 was injected into the epidural space and the flow of contrast was observed. Radiographs were obtained for documentation purposes.    The injectate was administered into the level noted above.   Additional Comments:  No complications occurred Dressing: 2 x 2 sterile gauze and Band-Aid    Post-procedure details: Patient was observed during the procedure. Post-procedure instructions were reviewed.  Patient left the clinic in stable condition.

## 2023-04-26 ENCOUNTER — Encounter: Payer: Self-pay | Admitting: Physical Medicine and Rehabilitation

## 2023-04-27 ENCOUNTER — Other Ambulatory Visit: Payer: Self-pay | Admitting: Physical Medicine and Rehabilitation

## 2023-04-27 DIAGNOSIS — M5416 Radiculopathy, lumbar region: Secondary | ICD-10-CM

## 2023-05-08 ENCOUNTER — Other Ambulatory Visit: Payer: Self-pay | Admitting: Physician Assistant

## 2023-05-08 MED ORDER — METHOCARBAMOL 500 MG PO TABS: 500.0000 mg | ORAL_TABLET | Freq: Every evening | ORAL | 0 refills | Status: AC

## 2023-05-13 IMAGING — US US ABDOMEN COMPLETE
1 series · 14 of 25 positions shown · non-contrast
Comparison: Coronary calcium scoring CT 10/09/2020

CLINICAL DATA: Hepatic cyst.  Abdominal bloating.

EXAM:
ABDOMEN ULTRASOUND COMPLETE

[Series 1: us abdomen complete · 0.23mm/px · 14 of 99 slices shown]
[im 1/99]
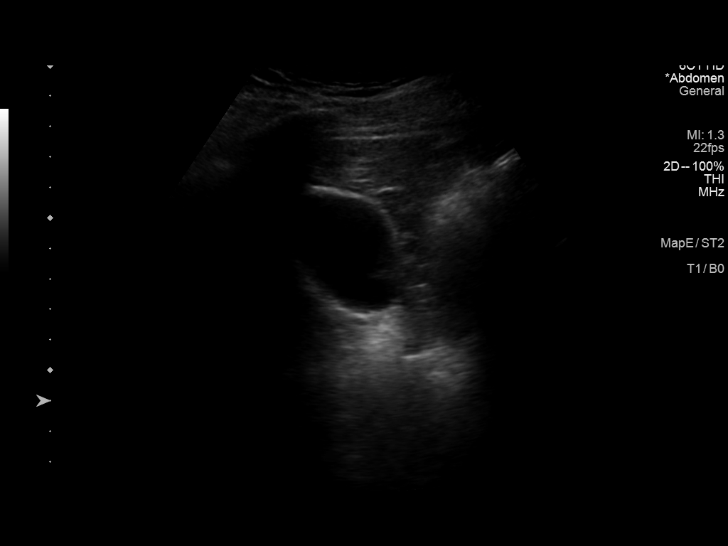
[im 9/99]
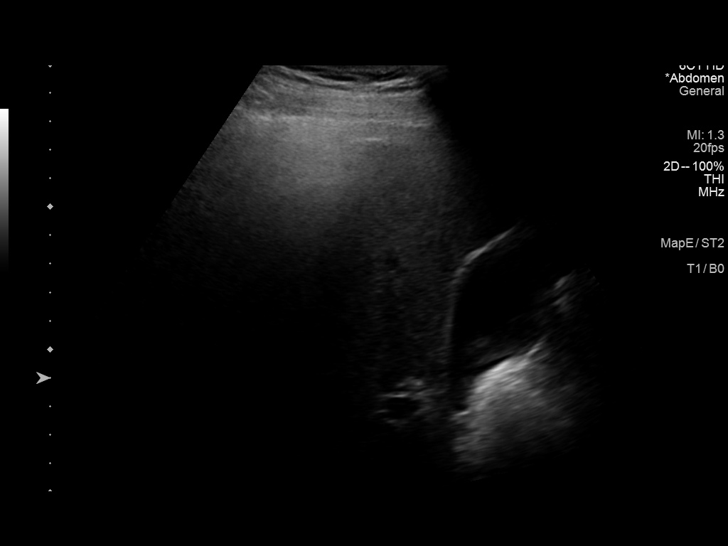
[im 17/99]
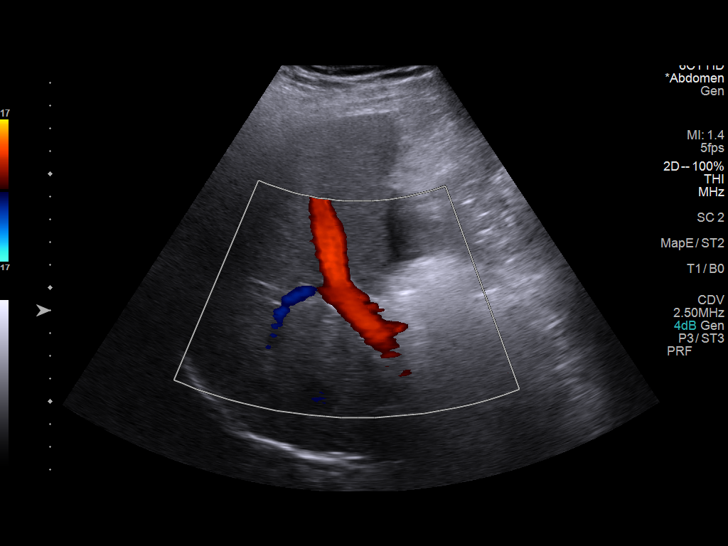
[im 25/99]
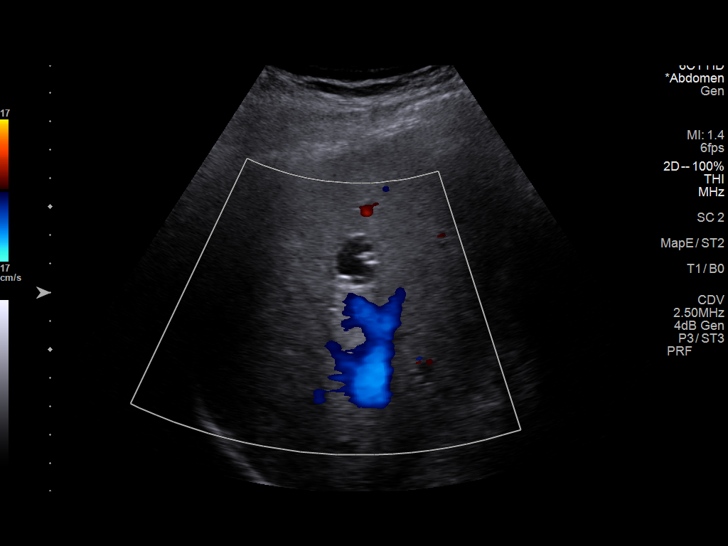
[im 33/99]
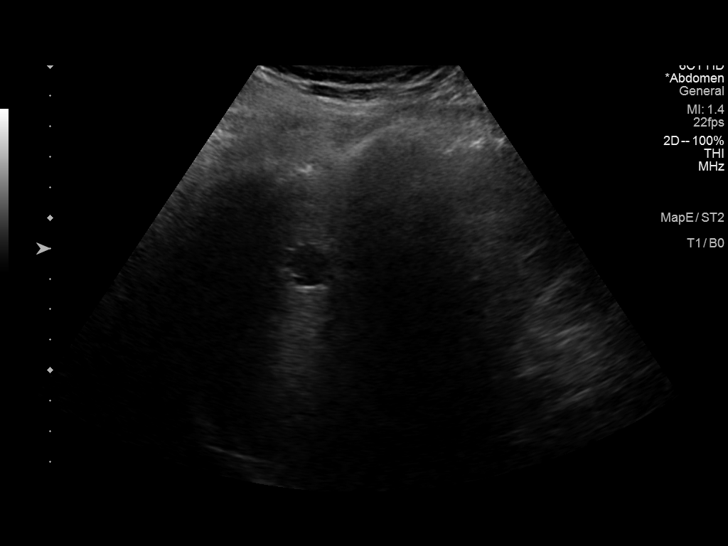
[im 37/99]
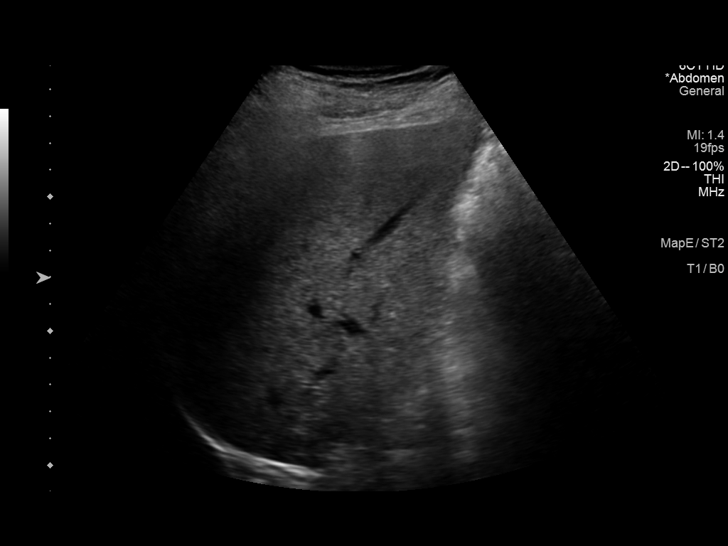
[im 45/99]
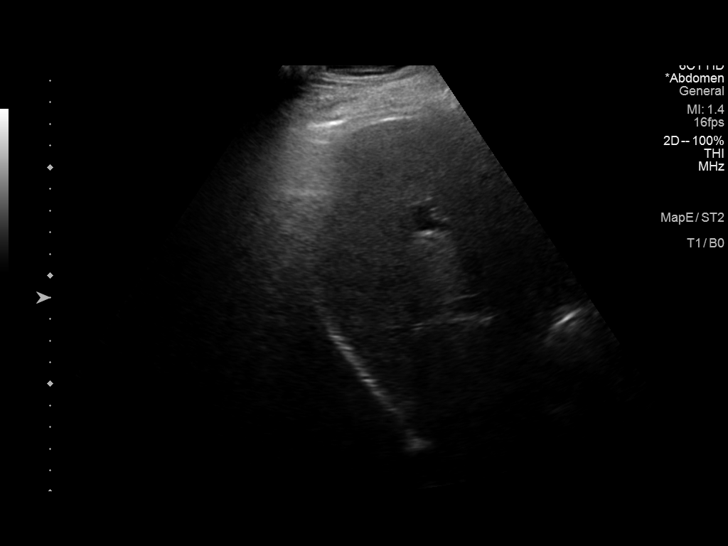
[im 54/99]
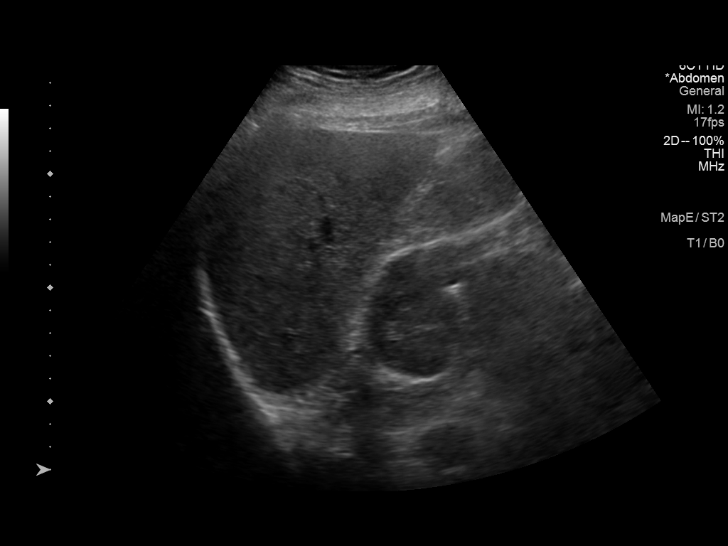
[im 62/99]
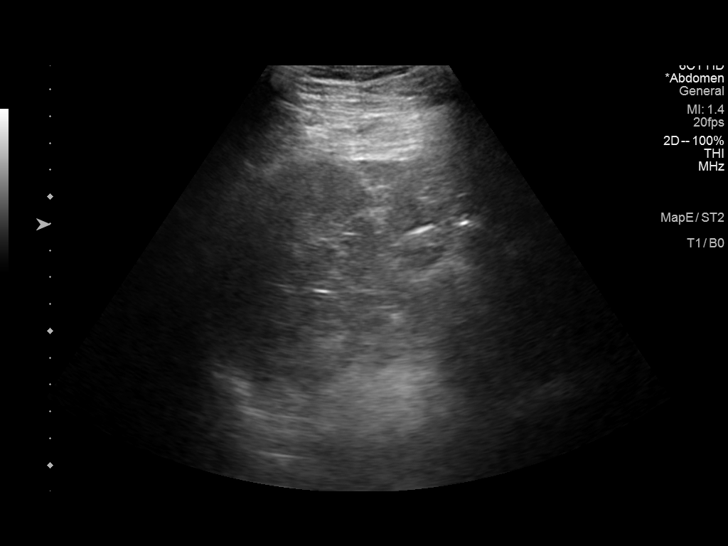
[im 66/99]
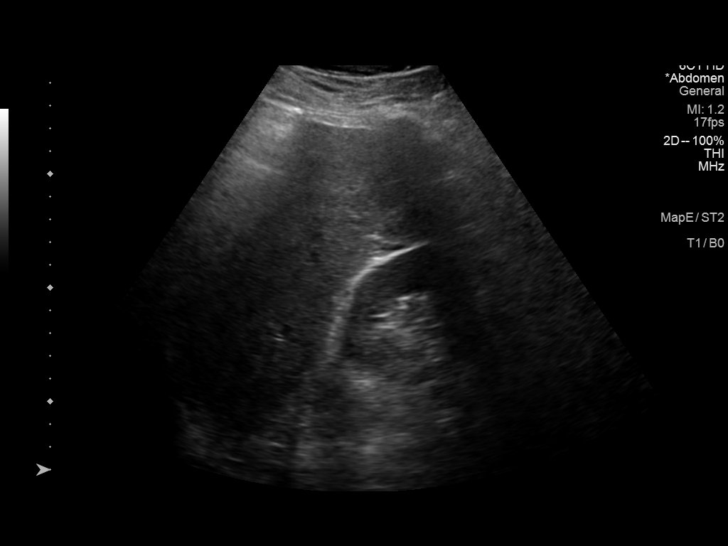
[im 74/99]
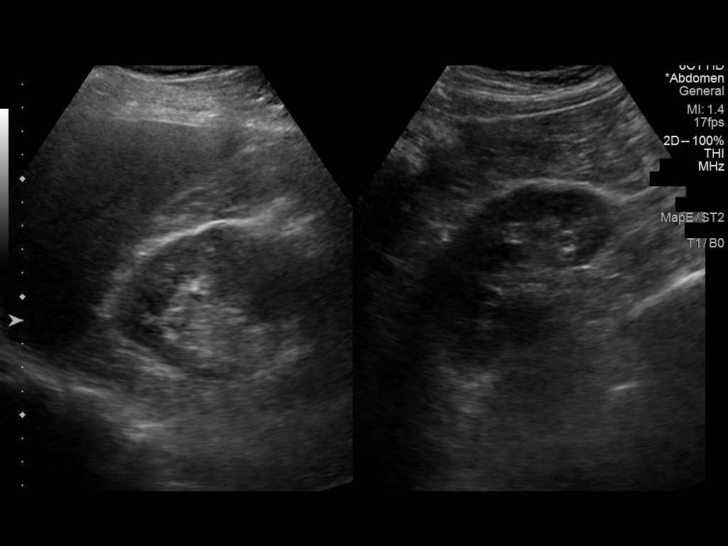
[im 82/99]
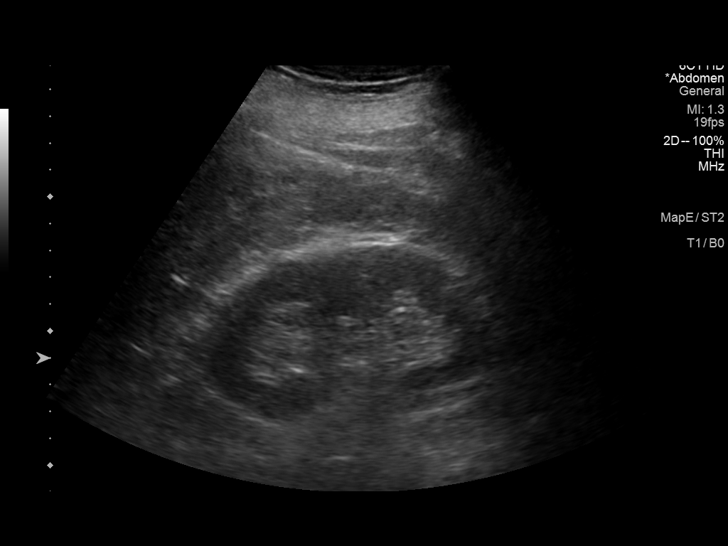
[im 90/99]
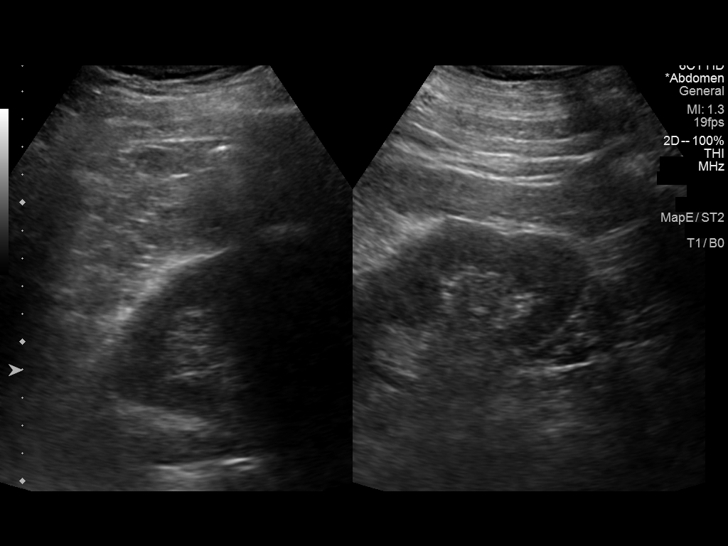
[im 99/99]
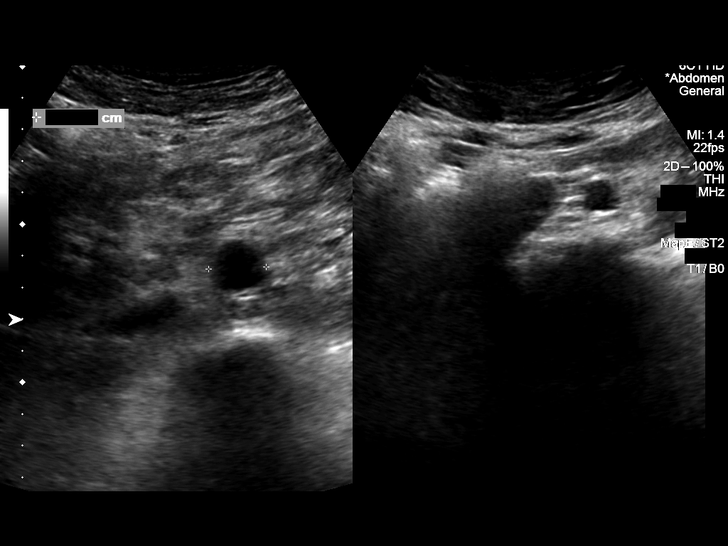

[14 of 25 positions shown; findings below may reference images not displayed]

FINDINGS: Gallbladder: No gallstones or wall thickening visualized. No
sonographic Murphy sign noted by sonographer.

Common bile duct: Diameter: 4 mm

Liver: Increased parenchymal echogenicity diffusely. Two adjacent
cysts in the right hepatic lobe measuring 1.9 and 1.6 cm with
lobular contours, thin internal septations, and no definite
solid/vascular component. Portal vein is patent on color Doppler
imaging with normal direction of blood flow towards the liver.

IVC: Partially obscured by bowel gas.

Pancreas: Obscured by bowel gas.

Spleen: Size and appearance within normal limits.

Right Kidney: Length: 13.0 cm. Echogenicity within normal limits. No
mass or hydronephrosis visualized.

Left Kidney: Length: 12.9 cm. Echogenicity within normal limits. No
mass or hydronephrosis visualized.

Abdominal aorta: No aneurysm visualized.

Other findings: None.
IMPRESSION: 1. Echogenic liver suggesting steatosis.
2. Two small mildly complex hepatic cysts.

## 2023-05-18 ENCOUNTER — Encounter: Payer: Self-pay | Admitting: Physical Medicine and Rehabilitation

## 2023-05-22 ENCOUNTER — Other Ambulatory Visit: Payer: Self-pay

## 2023-05-22 ENCOUNTER — Ambulatory Visit (INDEPENDENT_AMBULATORY_CARE_PROVIDER_SITE_OTHER): Payer: 59 | Admitting: Physical Medicine and Rehabilitation

## 2023-05-22 ENCOUNTER — Telehealth: Payer: Self-pay | Admitting: Physical Medicine and Rehabilitation

## 2023-05-22 DIAGNOSIS — M5416 Radiculopathy, lumbar region: Secondary | ICD-10-CM | POA: Diagnosis not present

## 2023-05-22 DIAGNOSIS — M4316 Spondylolisthesis, lumbar region: Secondary | ICD-10-CM | POA: Diagnosis not present

## 2023-05-22 DIAGNOSIS — M4306 Spondylolysis, lumbar region: Secondary | ICD-10-CM

## 2023-05-22 DIAGNOSIS — M48061 Spinal stenosis, lumbar region without neurogenic claudication: Secondary | ICD-10-CM | POA: Diagnosis not present

## 2023-05-22 DIAGNOSIS — Q762 Congenital spondylolisthesis: Secondary | ICD-10-CM

## 2023-05-22 NOTE — Patient Instructions (Signed)

## 2023-05-22 NOTE — Telephone Encounter (Signed)
Spoke with Christopher Hill with [employer] to arrange for lumbar brace. Patient informed he will receive phone call to schedule visit for sizing, he has no questions at this time.

## 2023-05-22 NOTE — Progress Notes (Signed)
Functional Pain Scale - descriptive words and definitions  Uncomfortable (3)  Pain is present but can complete all ADL's/sleep is slightly affected and passive distraction only gives marginal relief. Mild range order  Average Pain 3  156/85 R leg numbness / tingling  +Driver, -BT, -Dye Allergies.

## 2023-05-23 DIAGNOSIS — M4316 Spondylolisthesis, lumbar region: Secondary | ICD-10-CM | POA: Insufficient documentation

## 2023-05-23 DIAGNOSIS — Q762 Congenital spondylolisthesis: Secondary | ICD-10-CM | POA: Insufficient documentation

## 2023-05-23 DIAGNOSIS — M5416 Radiculopathy, lumbar region: Secondary | ICD-10-CM | POA: Insufficient documentation

## 2023-05-23 NOTE — Procedures (Signed)
Lumbar Epidural Steroid Injection - Interlaminar Approach with Fluoroscopic Guidance  Patient: Christopher Hill      Date of Birth: 05-06-1961 MRN: [id number] PCP: Merri Brunette, MD      Visit Date: 05/22/2023   Universal Protocol:     Consent Given By: the patient  Position: PRONE  Additional Comments: Vital signs were monitored before and after the procedure. Patient was prepped and draped in the usual sterile fashion. The correct patient, procedure, and site was verified.   Injection Procedure Details:   Procedure diagnoses: Lumbar radiculopathy [M54.16]   Meds Administered: Depomedrol 40 mg   Laterality: Right  Location/Site:  L5-S1  Needle: 3.5 in., 20 ga. Tuohy  Needle Placement: Paramedian epidural  Findings:   -Comments: Excellent flow of contrast into the epidural space.  Procedure Details: Using a paramedian approach from the side mentioned above, the region overlying the inferior lamina was localized under fluoroscopic visualization and the soft tissues overlying this structure were infiltrated with 4 ml. of 1% Lidocaine without Epinephrine. The Tuohy needle was inserted into the epidural space using a paramedian approach.   The epidural space was localized using loss of resistance along with counter oblique bi-planar fluoroscopic views.  After negative aspirate for air, blood, and CSF, a 2 ml. volume of Isovue-250 was injected into the epidural space and the flow of contrast was observed. Radiographs were obtained for documentation purposes.    The injectate was administered into the level noted above.   Additional Comments:  No complications occurred Dressing: 2 x 2 sterile gauze and Band-Aid    Post-procedure details: Patient was observed during the procedure. Post-procedure instructions were reviewed.  Patient left the clinic in stable condition.

## 2023-05-23 NOTE — Progress Notes (Signed)
[name] - 62 y.o. male MRN [id number]  Date of birth: April 04, 1961  Office Visit Note: Visit Date: 05/22/2023 PCP: Merri Brunette, MD Referred by: Merri Brunette, MD  Subjective: Chief Complaint  Patient presents with   Lower Back - Pain   HPI: Christopher Hill is a 62 y.o. male who comes in today for evaluation and management recent worsening of right radicular leg pain in a pretty classic L5 distribution.  By way of history the patient has known bilateral pars defects with pretty decent grade 1 almost 2 listhesis of L5 on S1 with foraminal narrowing bilaterally.  He also has a history of left hip replacement that was performed by Dr. Magnus Ivan who continues to follow with [place] well as with Rexene Edison, P.A.-C.  We did complete epidural injection in August which was a general interlaminar injection on the right with almost 100% relief of pain and paresthesia that lasted for quite some time and then over time he had some increasing symptoms off and on and really seem to have flared things up more recently with activity prolonged standing and furniture marked.  Also by way of review his initial symptoms started when he moved his daughter from [place] and he really had no issues prior to that and really did not even know that he had these bilateral pars defects at that time.  He is again having paresthesias in an L5 distribution to the foot.  No focal weakness no bowel or bladder changes no other red flag complaints.  Per the patient today his wife has had significant spine problems and has had recent surgery with Dr. Marikay Alar at [facility].  The patient has been doing home exercise program from physical therapy and did complete a good course of physical therapy.  He continues to stay pretty active.      Review of Systems  Musculoskeletal:  Positive for back pain and joint pain.  Neurological:  Positive for tingling.  All other systems reviewed and are negative.   Otherwise per HPI.  Assessment & Plan: Visit Diagnoses:    ICD-10-CM   1. Lumbar radiculopathy  M54.16 XR C-ARM NO REPORT    Epidural Steroid injection    2. Congenital spondylolysis  Q76.2     3. Spondylolisthesis of lumbar region  M43.16     4. Foraminal stenosis of lumbar region  M48.061        Plan: Findings:  Worsening of chronic now low back and right hip and leg pain consistent with L5 radiculopathy radiculitis from bilateral pars defects with foraminal narrowing.  He has had this condition for quite a while which we explained today but he had a flareup after incident back in the summer.  Discussed at length medication type management.  He does take a small amount of gabapentin just at night.  He has not tried Lyrica.  He did get good diagnostic and therapeutic relief with epidural injection from an interlaminar approach.  Neck step is to repeat that injection which we did do today given his acute symptoms and pretty significant paresthesia.  He did say that he got relief with the paresthesia before and this likely indicates more irritation than nerve damage.  Alternately could try at some point transforaminal epidural type injection.  We discussed him seeing a spine surgeon/neurosurgeon at some point just for information purposes and he would like to see Dr. Marikay Alar if that were the case but for right now or disc  and see how he does with the injection.  We also discussed home exercise program as well as the use of lumbar brace for certain activities like this last time where he was standing a lot at furniture market.  We did place order for lumbar brace to be fitted by [employer].    Meds & Orders: No orders of the defined types were placed in this encounter.   Orders Placed This Encounter  Procedures   XR C-ARM NO REPORT   Epidural Steroid injection    Follow-up: Return if symptoms worsen or fail to improve.   Procedures: No procedures performed  Lumbar Epidural Steroid  Injection - Interlaminar Approach with Fluoroscopic Guidance  Patient: [name]      Date of Birth: 03-12-1961 MRN: [id number] PCP: Merri Brunette, MD      Visit Date: 05/22/2023   Universal Protocol:     Consent Given By: the patient  Position: PRONE  Additional Comments: Vital signs were monitored before and after the procedure. Patient was prepped and draped in the usual sterile fashion. The correct patient, procedure, and site was verified.   Injection Procedure Details:   Procedure diagnoses: Lumbar radiculopathy [M54.16]   Meds Administered: Depomedrol 40 mg   Laterality: Right  Location/Site:  L5-S1  Needle: 3.5 in., 20 ga. Tuohy  Needle Placement: Paramedian epidural  Findings:   -Comments: Excellent flow of contrast into the epidural space.  Procedure Details: Using a paramedian approach from the side mentioned above, the region overlying the inferior lamina was localized under fluoroscopic visualization and the soft tissues overlying this structure were infiltrated with 4 ml. of 1% Lidocaine without Epinephrine. The Tuohy needle was inserted into the epidural space using a paramedian approach.   The epidural space was localized using loss of resistance along with counter oblique bi-planar fluoroscopic views.  After negative aspirate for air, blood, and CSF, a 2 ml. volume of Isovue-250 was injected into the epidural space and the flow of contrast was observed. Radiographs were obtained for documentation purposes.    The injectate was administered into the level noted above.   Additional Comments:  No complications occurred Dressing: 2 x 2 sterile gauze and Band-Aid    Post-procedure details: Patient was observed during the procedure. Post-procedure instructions were reviewed.  Patient left the clinic in stable condition.   Clinical History: MRI LUMBAR SPINE WITHOUT CONTRAST   TECHNIQUE: Multiplanar, multisequence MR imaging of the lumbar spine  was performed. No intravenous contrast was administered.   COMPARISON:  Lumbar spine radiographs 08/16/2022   FINDINGS: Segmentation: Conventional anatomy assumed, with the last open disc space designated L5-S1.Concordant with prior radiographs   Alignment: 11 mm of anterolisthesis at L5-S1 secondary to chronic bilateral L5 pars defects. Otherwise normal.   Vertebrae: As above, chronic appearing bilateral L5 pars defects. No evidence of acute fracture aggressive osseous lesion or discitis. There are mild endplate degenerative changes, greatest at L2-3. Mild sacroiliac degenerative changes bilaterally.   Conus medullaris: Extends to the L1 level and appears normal.   Paraspinal and other soft tissues: No significant paraspinal findings.   Disc levels:   Sagittal images demonstrate no significant disc space findings within the visualized lower thoracic spine. There is loss of disc height with disc bulging at T11-12.   L1-2: Preserved disc height with minimal disc bulging. No spinal stenosis or nerve root encroachment.   L2-3: Chronic degenerative disc disease with loss of disc height, disc bulging and endplate osteophytes. Mild facet and ligamentous hypertrophy.  No significant central spinal stenosis. There is mild lateral recess narrowing bilaterally. In addition, there is mild right-greater-than-left foraminal narrowing.   L3-4: Mild loss of disc height with mild disc bulging, facet and ligamentous hypertrophy. No significant spinal stenosis or lateral recess narrowing. Mild left greater than right foraminal narrowing   L4-5: Moderate loss of disc height with annular disc bulging and a broad-based central disc protrusion. Mild facet and ligamentous hypertrophy. No significant central spinal stenosis. There is mild lateral recess narrowing bilaterally and moderate left greater than right foraminal narrowing.   L5-S1: Bilateral L5 pars defects with resulting  anterolisthesis, diffuse disc bulging and uncovering. There is severe foraminal narrowing bilaterally with probable chronic bilateral L5 nerve root encroachment. The spinal canal and lateral recesses are sufficiently patent.   IMPRESSION: 1. Chronic bilateral L5 pars defects with resulting grade 2 anterolisthesis at L5-S1, contributing to severe foraminal narrowing bilaterally and probable chronic bilateral L5 nerve root encroachment. 2. Chronic degenerative disc disease at L2-3 with resulting mild lateral recess and foraminal narrowing bilaterally. 3. Mild lateral recess and moderate foraminal narrowing bilaterally at L4-5. 4. No acute findings or significant central spinal stenosis.     Electronically Signed   By: Carey Bullocks M.D.   On: 01/09/2023 10:26   He reports that he has never smoked. He has never used smokeless tobacco. No results for input(s): "HGBA1C", "LABURIC" in the last 8760 hours.  Objective:  VS:  HT:    WT:   BMI:     BP:   HR: bpm  TEMP: ( )  RESP:  Physical Exam Vitals and nursing note reviewed.  Constitutional:      General: He is not in acute distress.    Appearance: Normal appearance. He is well-developed. He is not ill-appearing.  HENT:     Head: Normocephalic and atraumatic.     Right Ear: External ear normal.     Left Ear: External ear normal.     Nose: No congestion.  Eyes:     Extraocular Movements: Extraocular movements intact.     Conjunctiva/sclera: Conjunctivae normal.     Pupils: Pupils are equal, round, and reactive to light.  Cardiovascular:     Rate and Rhythm: Normal rate.     Pulses: Normal pulses.     Heart sounds: Normal heart sounds.  Pulmonary:     Effort: Pulmonary effort is normal. No respiratory distress.  Abdominal:     General: There is no distension.     Palpations: Abdomen is soft.  Musculoskeletal:        General: Tenderness present. No signs of injury.     Cervical back: Normal range of motion and neck  supple. No rigidity.     Right lower leg: No edema.     Left lower leg: No edema.     Comments: Patient has good distal strength without clonus.  He has no pain with hip rotation no pain over the greater trochanters.  He has some back pain with extension.  He has more tight hamstring on the right than left.  Paresthesias dysesthesias in L5 dermatome on the right.  Skin:    General: Skin is warm and dry.     Findings: No erythema or rash.  Neurological:     General: No focal deficit present.     Mental Status: He is alert and oriented to person, place, and time.     Cranial Nerves: No cranial nerve deficit.     Sensory: Sensory deficit  present.     Motor: No weakness or abnormal muscle tone.     Coordination: Coordination normal.     Gait: Gait normal.  Psychiatric:        Mood and Affect: Mood normal.        Behavior: Behavior normal.     Ortho Exam  Imaging: XR C-ARM NO REPORT Result Date: 05/22/2023 Please see Notes tab for imaging impression.   Past Medical/Family/Surgical/Social History: Medications & Allergies reviewed per EMR, new medications updated. Patient Active Problem List   Diagnosis Date Noted   Congenital spondylolysis 05/23/2023   Spondylolisthesis of lumbar region 05/23/2023   Lumbar radiculopathy 05/23/2023   Status post total replacement of left hip 11/06/2018   Unilateral primary osteoarthritis, left hip 08/29/2018   Chronic lymphoblastic leukemia 01/21/2011   Allergic rhinitis due to pollen 08/16/2010   CONJUNCTIVITIS, ALLERGIC 01/23/2008   Past Medical History:  Diagnosis Date   Allergic rhinitis, cause unspecified    Arthritis    Chronic lymphoblastic leukemia    CLL (chronic lymphocytic leukemia) (HCC) 2014   History of kidney stones    Hyperlipidemia    Liver cyst    Other chronic allergic conjunctivitis    Family History  Problem Relation Age of Onset   Heart disease Mother        arterial   Allergies Mother    Otitis media Mother     Colon polyps Mother    Stroke Father    Other Father        CABG-valve replacement   Coronary artery disease Father        bypass   Diabetes Maternal Grandmother    Heart disease Maternal Grandmother    Lung cancer Maternal Grandmother    Colon cancer Maternal Grandmother 85   Prostate cancer Maternal Grandfather    Diabetes Maternal Grandfather    Heart disease Maternal Grandfather    Esophageal cancer Neg Hx    Stomach cancer Neg Hx    Pancreatic cancer Neg Hx    Liver disease Neg Hx    Past Surgical History:  Procedure Laterality Date   CATARACT EXTRACTION Bilateral    COLONOSCOPY     ESOPHAGOGASTRODUODENOSCOPY     LITHOTRIPSY  1997, 1995   TONSILLECTOMY  06/06/1962   TOTAL HIP ARTHROPLASTY Left 11/06/2018   Procedure: LEFT TOTAL HIP ARTHROPLASTY ANTERIOR APPROACH;  Surgeon: Kathryne Hitch, MD;  Location: [facility] OR;  Service: Orthopedics;  Laterality: Left;   Social History   Occupational History    Employer: [employer]  Tobacco Use   Smoking status: Never   Smokeless tobacco: Never  Vaping Use   Vaping status: Never Used  Substance and Sexual Activity   Alcohol use: No   Drug use: No   Sexual activity: Not on file

## 2023-06-06 ENCOUNTER — Other Ambulatory Visit: Payer: Self-pay | Admitting: Physical Medicine and Rehabilitation

## 2023-06-06 ENCOUNTER — Encounter: Payer: Self-pay | Admitting: Physical Medicine and Rehabilitation

## 2023-06-06 MED ORDER — GABAPENTIN 300 MG PO CAPS: 300.0000 mg | ORAL_CAPSULE | Freq: Three times a day (TID) | ORAL | 2 refills | Status: AC

## 2023-11-22 ENCOUNTER — Encounter: Payer: Self-pay | Admitting: Physical Medicine and Rehabilitation

## 2023-11-23 ENCOUNTER — Other Ambulatory Visit: Payer: Self-pay | Admitting: Physical Medicine and Rehabilitation

## 2023-11-23 DIAGNOSIS — M5416 Radiculopathy, lumbar region: Secondary | ICD-10-CM

## 2023-12-25 ENCOUNTER — Encounter: Admitting: Physical Medicine and Rehabilitation

## 2023-12-27 ENCOUNTER — Other Ambulatory Visit: Payer: Self-pay

## 2023-12-27 ENCOUNTER — Ambulatory Visit: Admitting: Physical Medicine and Rehabilitation

## 2023-12-27 VITALS — BP 132/78 | HR 65

## 2023-12-27 DIAGNOSIS — M5416 Radiculopathy, lumbar region: Secondary | ICD-10-CM

## 2023-12-27 MED ORDER — METHYLPREDNISOLONE ACETATE 40 MG/ML IJ SUSP
40.0000 mg | Freq: Once | INTRAMUSCULAR | Status: AC
  Administered 2023-12-27: 40 mg

## 2023-12-27 NOTE — Progress Notes (Signed)
 Christopher Hill - 63 y.o. male MRN [id number]  Date of birth: 08/29/1960  Office Visit Note: Visit Date: 12/27/2023 PCP: Clarice Nottingham, MD Referred by: Clarice Nottingham, MD  Subjective: Chief Complaint  Patient presents with   Lower Back - Pain   HPI:  Christopher Hill is a 63 y.o. male who comes in today for planned repeat Right L5-S1  Lumbar Interlaminar epidural steroid injection with fluoroscopic guidance.  The patient has failed conservative care including home exercise, medications, time and activity modification.  This injection will be diagnostic and hopefully therapeutic.  Please see requesting physician notes for further details and justification. Patient received more than 50% pain relief from prior injection.   Referring: Duwaine Pouch, FNP   ROS Otherwise per HPI.  Assessment & Plan: Visit Diagnoses:    ICD-10-CM   1. Lumbar radiculopathy  M54.16 XR C-ARM NO REPORT    Epidural Steroid injection    methylPREDNISolone  acetate (DEPO-MEDROL ) injection 40 mg      Plan: No additional findings.   Meds & Orders:  Meds ordered this encounter  Medications   methylPREDNISolone  acetate (DEPO-MEDROL ) injection 40 mg    Orders Placed This Encounter  Procedures   XR C-ARM NO REPORT   Epidural Steroid injection    Follow-up: Return for visit to requesting provider as needed.   Procedures: No procedures performed  Lumbar Epidural Steroid Injection - Interlaminar Approach with Fluoroscopic Guidance  Patient: Christopher Hill      Date of Birth: November 14, 1960 MRN: [id number] PCP: Clarice Nottingham, MD      Visit Date: 12/27/2023   Universal Protocol:     Consent Given By: the patient  Position: PRONE  Additional Comments: Vital signs were monitored before and after the procedure. Patient was prepped and draped in the usual sterile fashion. The correct patient, procedure, and site was verified.   Injection Procedure Details:   Procedure diagnoses: Lumbar radiculopathy  [M54.16]   Meds Administered:  Meds ordered this encounter  Medications   methylPREDNISolone  acetate (DEPO-MEDROL ) injection 40 mg     Laterality: Right  Location/Site:  L5-S1  Needle: 3.5 in., 20 ga. Tuohy  Needle Placement: Paramedian epidural  Findings:   -Comments: Excellent flow of contrast into the epidural space.  Procedure Details: Using a paramedian approach from the side mentioned above, the region overlying the inferior lamina was localized under fluoroscopic visualization and the soft tissues overlying this structure were infiltrated with 4 ml. of 1% Lidocaine without Epinephrine . The Tuohy needle was inserted into the epidural space using a paramedian approach.   The epidural space was localized using loss of resistance along with counter oblique bi-planar fluoroscopic views.  After negative aspirate for air, blood, and CSF, a 2 ml. volume of Isovue-250 was injected into the epidural space and the flow of contrast was observed. Radiographs were obtained for documentation purposes.    The injectate was administered into the level noted above.   Additional Comments:  The patient tolerated the procedure well Dressing: 2 x 2 sterile gauze and Band-Aid    Post-procedure details: Patient was observed during the procedure. Post-procedure instructions were reviewed.  Patient left the clinic in stable condition.   Clinical History: MRI LUMBAR SPINE WITHOUT CONTRAST   TECHNIQUE: Multiplanar, multisequence MR imaging of the lumbar spine was performed. No intravenous contrast was administered.   COMPARISON:  Lumbar spine radiographs 08/16/2022   FINDINGS: Segmentation: Conventional anatomy assumed, with the last open disc space designated L5-S1.Concordant with prior radiographs  Alignment: 11 mm of anterolisthesis at L5-S1 secondary to chronic bilateral L5 pars defects. Otherwise normal.   Vertebrae: As above, chronic appearing bilateral L5 pars defects.  No evidence of acute fracture aggressive osseous lesion or discitis. There are mild endplate degenerative changes, greatest at L2-3. Mild sacroiliac degenerative changes bilaterally.   Conus medullaris: Extends to the L1 level and appears normal.   Paraspinal and other soft tissues: No significant paraspinal findings.   Disc levels:   Sagittal images demonstrate no significant disc space findings within the visualized lower thoracic spine. There is loss of disc height with disc bulging at T11-12.   L1-2: Preserved disc height with minimal disc bulging. No spinal stenosis or nerve root encroachment.   L2-3: Chronic degenerative disc disease with loss of disc height, disc bulging and endplate osteophytes. Mild facet and ligamentous hypertrophy. No significant central spinal stenosis. There is mild lateral recess narrowing bilaterally. In addition, there is mild right-greater-than-left foraminal narrowing.   L3-4: Mild loss of disc height with mild disc bulging, facet and ligamentous hypertrophy. No significant spinal stenosis or lateral recess narrowing. Mild left greater than right foraminal narrowing   L4-5: Moderate loss of disc height with annular disc bulging and a broad-based central disc protrusion. Mild facet and ligamentous hypertrophy. No significant central spinal stenosis. There is mild lateral recess narrowing bilaterally and moderate left greater than right foraminal narrowing.   L5-S1: Bilateral L5 pars defects with resulting anterolisthesis, diffuse disc bulging and uncovering. There is severe foraminal narrowing bilaterally with probable chronic bilateral L5 nerve root encroachment. The spinal canal and lateral recesses are sufficiently patent.   IMPRESSION: 1. Chronic bilateral L5 pars defects with resulting grade 2 anterolisthesis at L5-S1, contributing to severe foraminal narrowing bilaterally and probable chronic bilateral L5 nerve  root encroachment. 2. Chronic degenerative disc disease at L2-3 with resulting mild lateral recess and foraminal narrowing bilaterally. 3. Mild lateral recess and moderate foraminal narrowing bilaterally at L4-5. 4. No acute findings or significant central spinal stenosis.     Electronically Signed   By: Elsie Perone M.D.   On: 01/09/2023 10:26     Objective:  VS:  HT:    WT:   BMI:     BP:132/78  HR:65bpm  TEMP: ( )  RESP:  Physical Exam Vitals and nursing note reviewed.  Constitutional:      General: He is not in acute distress.    Appearance: Normal appearance. He is not ill-appearing.  HENT:     Head: Normocephalic and atraumatic.     Right Ear: External ear normal.     Left Ear: External ear normal.     Nose: No congestion.  Eyes:     Extraocular Movements: Extraocular movements intact.  Cardiovascular:     Rate and Rhythm: Normal rate.     Pulses: Normal pulses.  Pulmonary:     Effort: Pulmonary effort is normal. No respiratory distress.  Abdominal:     General: There is no distension.     Palpations: Abdomen is soft.  Musculoskeletal:        General: No tenderness or signs of injury.     Cervical back: Neck supple.     Right lower leg: No edema.     Left lower leg: No edema.     Comments: Patient has good distal strength without clonus.  Skin:    Findings: No erythema or rash.  Neurological:     General: No focal deficit present.     Mental  Status: He is alert and oriented to person, place, and time.     Sensory: No sensory deficit.     Motor: No weakness or abnormal muscle tone.     Coordination: Coordination normal.  Psychiatric:        Mood and Affect: Mood normal.        Behavior: Behavior normal.      Imaging: No results found.

## 2023-12-27 NOTE — Patient Instructions (Signed)

## 2023-12-27 NOTE — Progress Notes (Signed)
 Pain Scale   Average Pain 5 Patient advising he has lover back pain radiating to right leg and standing and walking increase his pain. Patient advising his pain lessens with sitting and resting.        +Driver, -BT, -Dye Allergies.

## 2023-12-31 NOTE — Procedures (Signed)
 Lumbar Epidural Steroid Injection - Interlaminar Approach with Fluoroscopic Guidance  Patient: Christopher Hill      Date of Birth: 1960/10/23 MRN: [id number] PCP: Clarice Nottingham, MD      Visit Date: 12/27/2023   Universal Protocol:     Consent Given By: the patient  Position: PRONE  Additional Comments: Vital signs were monitored before and after the procedure. Patient was prepped and draped in the usual sterile fashion. The correct patient, procedure, and site was verified.   Injection Procedure Details:   Procedure diagnoses: Lumbar radiculopathy [M54.16]   Meds Administered:  Meds ordered this encounter  Medications   methylPREDNISolone  acetate (DEPO-MEDROL ) injection 40 mg     Laterality: Right  Location/Site:  L5-S1  Needle: 3.5 in., 20 ga. Tuohy  Needle Placement: Paramedian epidural  Findings:   -Comments: Excellent flow of contrast into the epidural space.  Procedure Details: Using a paramedian approach from the side mentioned above, the region overlying the inferior lamina was localized under fluoroscopic visualization and the soft tissues overlying this structure were infiltrated with 4 ml. of 1% Lidocaine without Epinephrine . The Tuohy needle was inserted into the epidural space using a paramedian approach.   The epidural space was localized using loss of resistance along with counter oblique bi-planar fluoroscopic views.  After negative aspirate for air, blood, and CSF, a 2 ml. volume of Isovue-250 was injected into the epidural space and the flow of contrast was observed. Radiographs were obtained for documentation purposes.    The injectate was administered into the level noted above.   Additional Comments:  The patient tolerated the procedure well Dressing: 2 x 2 sterile gauze and Band-Aid    Post-procedure details: Patient was observed during the procedure. Post-procedure instructions were reviewed.  Patient left the clinic in stable condition.

## 2024-03-18 DIAGNOSIS — Z79899 Other long term (current) drug therapy: Secondary | ICD-10-CM | POA: Diagnosis not present

## 2024-03-18 DIAGNOSIS — C9110 Chronic lymphocytic leukemia of B-cell type not having achieved remission: Secondary | ICD-10-CM | POA: Diagnosis not present

## 2024-04-08 ENCOUNTER — Encounter: Payer: Self-pay | Admitting: Radiology

## 2024-04-30 ENCOUNTER — Ambulatory Visit
